# Patient Record
Sex: Male | Born: 1955 | Race: White | Hispanic: No | Marital: Married | Smoking: Former smoker
Health system: Southern US, Community
[De-identification: ages and names within clinical notes are randomized; demographics above are authoritative.]

## PROBLEM LIST (undated history)

## (undated) DIAGNOSIS — F329 Major depressive disorder, single episode, unspecified: Secondary | ICD-10-CM

## (undated) DIAGNOSIS — I10 Essential (primary) hypertension: Secondary | ICD-10-CM

## (undated) DIAGNOSIS — J302 Other seasonal allergic rhinitis: Secondary | ICD-10-CM

## (undated) DIAGNOSIS — F32A Depression, unspecified: Secondary | ICD-10-CM

## (undated) DIAGNOSIS — F209 Schizophrenia, unspecified: Secondary | ICD-10-CM

## (undated) HISTORY — DX: Other seasonal allergic rhinitis: J30.2

---

## 2000-08-16 ENCOUNTER — Emergency Department (HOSPITAL_COMMUNITY): Admission: EM | Admit: 2000-08-16 | Discharge: 2000-08-16 | Payer: Self-pay | Admitting: Emergency Medicine

## 2001-11-11 ENCOUNTER — Emergency Department (HOSPITAL_COMMUNITY): Admission: EM | Admit: 2001-11-11 | Discharge: 2001-11-11 | Payer: Self-pay | Admitting: Emergency Medicine

## 2001-11-11 ENCOUNTER — Encounter: Payer: Self-pay | Admitting: Family Medicine

## 2003-02-09 ENCOUNTER — Emergency Department (HOSPITAL_COMMUNITY): Admission: EM | Admit: 2003-02-09 | Discharge: 2003-02-09 | Payer: Self-pay | Admitting: Emergency Medicine

## 2004-07-19 ENCOUNTER — Ambulatory Visit: Payer: Self-pay | Admitting: Family Medicine

## 2004-09-03 ENCOUNTER — Ambulatory Visit: Payer: Self-pay | Admitting: Family Medicine

## 2004-11-29 ENCOUNTER — Ambulatory Visit: Payer: Self-pay | Admitting: Sports Medicine

## 2004-12-03 ENCOUNTER — Ambulatory Visit: Payer: Self-pay | Admitting: Family Medicine

## 2005-03-21 ENCOUNTER — Ambulatory Visit: Payer: Self-pay | Admitting: Sports Medicine

## 2005-08-15 ENCOUNTER — Ambulatory Visit: Payer: Self-pay | Admitting: Family Medicine

## 2006-12-11 DIAGNOSIS — F17200 Nicotine dependence, unspecified, uncomplicated: Secondary | ICD-10-CM | POA: Insufficient documentation

## 2006-12-11 DIAGNOSIS — F209 Schizophrenia, unspecified: Secondary | ICD-10-CM | POA: Insufficient documentation

## 2006-12-11 DIAGNOSIS — K219 Gastro-esophageal reflux disease without esophagitis: Secondary | ICD-10-CM | POA: Insufficient documentation

## 2008-01-18 ENCOUNTER — Emergency Department (HOSPITAL_COMMUNITY): Admission: EM | Admit: 2008-01-18 | Discharge: 2008-01-18 | Payer: Self-pay | Admitting: Emergency Medicine

## 2008-02-14 ENCOUNTER — Emergency Department (HOSPITAL_COMMUNITY): Admission: EM | Admit: 2008-02-14 | Discharge: 2008-02-14 | Payer: Self-pay | Admitting: Emergency Medicine

## 2010-08-23 ENCOUNTER — Emergency Department (HOSPITAL_COMMUNITY): Admission: EM | Admit: 2010-08-23 | Discharge: 2010-08-23 | Payer: Self-pay | Admitting: Emergency Medicine

## 2011-03-01 NOTE — Op Note (Signed)
[place]. [facility]  Patient:    Duane Hancock, Duane Hancock Visit Number: [id number] MRN: [id number]          Service Type: EMS Location: [place] Attending Physician:  Ilene Qua Dictated by:   Nicki Reaper, M.D. Proc. Date: 11/10/01 Admit Date:  11/11/2001                             Operative Report  PREOPERATIVE DIAGNOSIS:  Avulsion radial pulp left index finger.  POSTOPERATIVE DIAGNOSIS:  Avulsion radial pulp left index finger.  OPERATION:  Repair of skin graft, radial pulp and left index finger.  SURGEON:  Nicki Reaper, M.D.  ANESTHESIA:  Metacarpal block.  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  He suffered an avulsion of the radial pulp nail of his left index finger.  PROCEDURE:  The patient had been given a metacarpal block.  We used Betadine solution and a Penrose drain was used for tourniquet control to the patients left index finger.  The amputated portion was defatted.  The small fleck of bone was present and this was removed.  The finger tip was placed in its normal bed after pressure for hemostasis.  This was then sutured into position with interrupted 5-0 chromic sutures.  The nail fold was reapproximated with 5-0 chromic.  A stent dressing was applied with Steri-Strips.  Sterile compressive dressing and splint was applied.  The patient tolerated the procedure well.  He was discharged home to return in two weeks on Vicodin and Keflex. Dictated by:   Nicki Reaper, M.D. Attending Physician:  Ilene Qua DD:  11/11/01 TD:  11/11/01 Job: [id number]

## 2013-01-04 ENCOUNTER — Emergency Department (HOSPITAL_COMMUNITY)
Admission: EM | Admit: 2013-01-04 | Discharge: 2013-01-04 | Disposition: A | Discharge status: Home / Self Care | Payer: Medicare Other | Attending: Emergency Medicine | Admitting: Emergency Medicine

## 2013-01-04 ENCOUNTER — Encounter (HOSPITAL_COMMUNITY): Payer: Self-pay | Admitting: *Deleted

## 2013-01-04 DIAGNOSIS — R21 Rash and other nonspecific skin eruption: Secondary | ICD-10-CM | POA: Insufficient documentation

## 2013-01-04 DIAGNOSIS — Z79899 Other long term (current) drug therapy: Secondary | ICD-10-CM | POA: Insufficient documentation

## 2013-01-04 DIAGNOSIS — L299 Pruritus, unspecified: Secondary | ICD-10-CM | POA: Insufficient documentation

## 2013-01-04 DIAGNOSIS — F3289 Other specified depressive episodes: Secondary | ICD-10-CM | POA: Insufficient documentation

## 2013-01-04 DIAGNOSIS — F329 Major depressive disorder, single episode, unspecified: Secondary | ICD-10-CM | POA: Insufficient documentation

## 2013-01-04 DIAGNOSIS — Z87891 Personal history of nicotine dependence: Secondary | ICD-10-CM | POA: Insufficient documentation

## 2013-01-04 HISTORY — DX: Major depressive disorder, single episode, unspecified: F32.9

## 2013-01-04 HISTORY — DX: Depression, unspecified: F32.A

## 2013-01-04 MED ORDER — HYDROXYZINE HCL 25 MG PO TABS: 25.0000 mg | ORAL_TABLET | Freq: Four times a day (QID) | ORAL | Status: DC

## 2013-01-04 MED ORDER — SULFAMETHOXAZOLE-TRIMETHOPRIM 800-160 MG PO TABS: 1.0000 | ORAL_TABLET | Freq: Two times a day (BID) | ORAL | Status: DC

## 2013-01-04 MED ORDER — TRIAMCINOLONE ACETONIDE 0.1 % EX CREA: TOPICAL_CREAM | Freq: Three times a day (TID) | CUTANEOUS | Status: DC

## 2013-01-04 NOTE — ED Provider Notes (Signed)
History     CSN: [id number]  Arrival date & time 01/04/13  1235   First MD Initiated Contact with Patient 01/04/13 1343      Chief Complaint  Patient presents with  . Rash    (Consider location/radiation/quality/duration/timing/severity/associated sxs/prior treatment) HPI Comments: patient c/o rash to his right lower leg for 2 months.  States he was applying OTC creams and rash was improved, but then returned.  He c/o itching and his wife states that he scratches it in his sleep.  He denies swelling, new medications or similar rash elsewhere.  He states he is not a diabetic and does not have a hx of PVD  Patient is a 57 y.o. male presenting with rash. The history is provided by the patient.  Rash Location:  Leg Leg rash location:  R lower leg Quality: itchiness, redness and scaling   Quality: not blistering, not bruising, not burning, not draining, not dry, not painful, not peeling, not swelling and not weeping   Severity:  Moderate Onset quality:  Gradual Duration:  2 months Timing:  Constant Progression:  Waxing and waning Chronicity:  Chronic Context: not chemical exposure, not food, not medications, not new detergent/soap and not sick contacts   Relieved by:  Antibiotic cream and anti-itch cream Worsened by:  Nothing tried Ineffective treatments:  None tried Associated symptoms: no fever, no headaches, no induration, no joint pain, no myalgias, no nausea, no shortness of breath, no sore throat and not wheezing     Past Medical History  Diagnosis Date  . Depression     History reviewed. No pertinent past surgical history.  No family history on file.  History  Substance Use Topics  . Smoking status: Former Games developer  . Smokeless tobacco: Not on file  . Alcohol Use: No      Review of Systems  Constitutional: Negative for fever, chills, activity change and appetite change.  HENT: Negative for sore throat, facial swelling, trouble swallowing, neck pain and neck  stiffness.   Respiratory: Negative for chest tightness, shortness of breath and wheezing.   Gastrointestinal: Negative for nausea.  Musculoskeletal: Negative for myalgias and arthralgias.  Skin: Positive for color change and rash. Negative for wound.  Neurological: Negative for dizziness, weakness, numbness and headaches.  Hematological: Negative for adenopathy. Does not bruise/bleed easily.  All other systems reviewed and are negative.    Allergies  Review of patient's allergies indicates no known allergies.  Home Medications   Current Outpatient Rx  Name  Route  Sig  Dispense  Refill  . amitriptyline (ELAVIL) 25 MG tablet   Oral   Take 25 mg by mouth at bedtime.         Marland Kitchen omeprazole (PRILOSEC OTC) 20 MG tablet   Oral   Take 20 mg by mouth daily.           BP 144/97  Pulse 92  Temp(Src) 98.1 F (36.7 C) (Oral)  Resp 18  Ht 6' (1.829 m)  Wt 250 lb (113.399 kg)  BMI 33.9 kg/m2  SpO2 98%  Physical Exam  Nursing note and vitals reviewed. Constitutional: He is oriented to person, place, and time. He appears well-developed and well-nourished. No distress.  HENT:  Head: Normocephalic and atraumatic.  Cardiovascular: Normal rate, regular rhythm, normal heart sounds and intact distal pulses.   No murmur heard. Pulmonary/Chest: Effort normal and breath sounds normal. He has no wheezes.  Musculoskeletal: He exhibits no edema and no tenderness.  Neurological: He is alert and  oriented to person, place, and time. No cranial nerve deficit or sensory deficit. He exhibits normal muscle tone. Coordination and gait normal.  Skin: Skin is warm and dry. Rash noted.     Large , erythematous plaque to the medical right lower leg.  Excoriations also present and mild scaly areas.  No drainage, pus, or induration.  DP pulses brisk, distal sensation intact.      ED Course  Procedures (including critical care time)  Labs Reviewed - No data to display No results  found.      MDM    Erythematous plaque tot he medial right lower leg.  Several areas of excoriation present.  No drainage or induration.  NV intact.  No hx of PVD, or DM.    I will treat with steriod taper, antibiotic to cover for secondary infection, and vistaril .  Patient agrees to close f/u with his PMD.  Dermatology referral also given  The patient appears reasonably screened and/or stabilized for discharge and I doubt any other medical condition or other [facility] requiring further screening, evaluation, or treatment in the ED at this time prior to discharge.     Zyaira Vejar L. Trisha Mangle, PA-C 01/06/13 1548

## 2013-01-04 NOTE — ED Notes (Signed)
Rash to right lower leg x 2 months.  States is getting worse.  C/o itching.

## 2013-01-04 NOTE — ED Notes (Signed)
Alert, NAD, pt has red itching rash to bil lower legs, mostly medial lower aspect,  Rt leg is worse.  Pt has been using otc cortisone creme without improvement.

## 2013-01-07 NOTE — ED Provider Notes (Signed)
Medical screening examination/treatment/procedure(s) were performed by non-physician practitioner and as supervising physician I was immediately available for consultation/collaboration.  Christopher J. Pollina, MD 01/07/13 0700 

## 2013-12-07 ENCOUNTER — Encounter: Payer: Self-pay | Admitting: Internal Medicine

## 2014-01-13 ENCOUNTER — Ambulatory Visit (AMBULATORY_SURGERY_CENTER): Payer: Self-pay | Admitting: *Deleted

## 2014-01-13 VITALS — Ht 71.0 in | Wt 264.0 lb

## 2014-01-13 DIAGNOSIS — Z1211 Encounter for screening for malignant neoplasm of colon: Secondary | ICD-10-CM

## 2014-01-13 MED ORDER — MOVIPREP 100 G PO SOLR: 1.0000 | Freq: Once | ORAL | Status: DC

## 2014-01-13 NOTE — Progress Notes (Signed)
Denies allergies to eggs or soy products. Denies complications with sedation or anesthesia. 

## 2014-01-27 ENCOUNTER — Ambulatory Visit (AMBULATORY_SURGERY_CENTER): Payer: Medicare Other | Admitting: Internal Medicine

## 2014-01-27 ENCOUNTER — Encounter: Payer: Self-pay | Admitting: Internal Medicine

## 2014-01-27 VITALS — BP 110/75 | HR 69 | Temp 98.8°F | Resp 21 | Ht 71.0 in | Wt 264.0 lb

## 2014-01-27 DIAGNOSIS — D126 Benign neoplasm of colon, unspecified: Secondary | ICD-10-CM

## 2014-01-27 DIAGNOSIS — Z1211 Encounter for screening for malignant neoplasm of colon: Secondary | ICD-10-CM

## 2014-01-27 MED ORDER — SODIUM CHLORIDE 0.9 % IV SOLN: 500.0000 mL | INTRAVENOUS | Status: DC

## 2014-01-27 NOTE — Op Note (Signed)
[place]  [address]. [place], [address]   COLONOSCOPY PROCEDURE REPORT  PATIENT: Duane, Hancock  MR#: [id number] BIRTHDATE: 1956/09/10 , 66  yrs. old GENDER: Male ENDOSCOPIST: Jerene Bears, MD REFERRED GY:IRSWN Jeanie Cooks, M.D. PROCEDURE DATE:  01/27/2014 PROCEDURE:   Colonoscopy with cold biopsy polypectomy and Colonoscopy with snare polypectomy First Screening Colonoscopy - Avg.  risk and is 50 yrs.  old or older Yes.  Prior Negative Screening - Now for repeat screening. N/A  History of Adenoma - Now for follow-up colonoscopy & has been > or = to 3 yrs.  N/A  Polyps Removed Today? Yes. ASA CLASS:   Class II INDICATIONS:average risk screening and first colonoscopy. MEDICATIONS: MAC sedation, administered by CRNA and propofol (Diprivan) 500mg  IV  DESCRIPTION OF PROCEDURE:   After the risks benefits and alternatives of the procedure were thoroughly explained, informed consent was obtained.  A digital rectal exam revealed no rectal mass.   The LB [id number]  endoscope was introduced through the anus and advanced to the cecum, which was identified by both the appendix and ileocecal valve. No adverse events experienced. The quality of the prep was good, using MoviPrep  The instrument was then slowly withdrawn as the colon was fully examined.   COLON FINDINGS: Two sessile polyps measuring 5-6 mm in size were found at the cecum and in the transverse colon.  Polypectomy was performed using cold snare.  All resections were complete and all polyp tissue was completely retrieved.   Five sessile polyps measuring 3-8 mm in size were found in the descending colon (4) and sigmoid colon (1).  A polypectomy was performed with cold forceps (2), with a cold snare (2) and using snare cautery (1).  The resection was complete and the polyp tissue was completely retrieved.   A pedunculated polyp measuring 10 mm in size was found in the sigmoid colon.  A polypectomy was  performed using snare cautery.  The resection was complete and the polyp tissue was completely retrieved.   Mild diverticulosis was noted in the sigmoid colon.  Retroflexion was not performed due to a narrow rectal vault. The time to cecum=4 minutes 44 seconds.  Withdrawal time=17 minutes 51 seconds.  The scope was withdrawn and the procedure completed.  COMPLICATIONS: There were no complications.  ENDOSCOPIC IMPRESSION: 1.   Two sessile polyps measuring 5-6 mm in size were found at the cecum and in the transverse colon; Polypectomy was performed using cold snare 2.   Five sessile polyps measuring 3-8 mm in size were found in the descending colon and sigmoid colon; polypectomy was performed with cold forceps, with a cold snare and using snare cautery 3.   Pedunculated polyp measuring 10 mm in size was found in the sigmoid colon; polypectomy was performed using snare cautery 4.   Mild diverticulosis was noted in the sigmoid colon   RECOMMENDATIONS: 1.  Await pathology results 2.  Avoid NSAIDs for 2 weeks 3.  High fiber diet 4.  Timing of repeat colonoscopy will be determined by pathology findings. 5.  You will receive a letter within 1-2 weeks with the results of your biopsy as well as final recommendations.  Please call my office if you have not received a letter after 3 weeks.   eSigned:  Jerene Bears, MD 01/27/2014 8:51 AM   cc: The Patient and Nolene Ebbs, MD   PATIENT NAME:  Duane, Hancock MR#: [id number]

## 2014-01-27 NOTE — Patient Instructions (Addendum)
YOU HAD AN ENDOSCOPIC PROCEDURE TODAY AT THE [facility]: Refer to the procedure report that was given to you for any specific questions about what was found during the examination.  If the procedure report does not answer your questions, please call your gastroenterologist to clarify.  If you requested that your care partner not be given the details of your procedure findings, then the procedure report has been included in a sealed envelope for you to review at your convenience later.  YOU SHOULD EXPECT: Some feelings of bloating in the abdomen. Passage of more gas than usual.  Walking can help get rid of the air that was put into your GI tract during the procedure and reduce the bloating. If you had a lower endoscopy (such as a colonoscopy or flexible sigmoidoscopy) you may notice spotting of blood in your stool or on the toilet paper. If you underwent a bowel prep for your procedure, then you may not have a normal bowel movement for a few days.  DIET: Your first meal following the procedure should be a light meal and then it is ok to progress to your normal diet.  A half-sandwich or bowl of soup is an example of a good first meal.  Heavy or fried foods are harder to digest and may make you feel nauseous or bloated.  Likewise meals heavy in dairy and vegetables can cause extra gas to form and this can also increase the bloating.  Drink plenty of fluids but you should avoid alcoholic beverages for 24 hours.  ACTIVITY: Your care partner should take you home directly after the procedure.  You should plan to take it easy, moving slowly for the rest of the day.  You can resume normal activity the day after the procedure however you should NOT DRIVE or use heavy machinery for 24 hours (because of the sedation medicines used during the test).    SYMPTOMS TO REPORT IMMEDIATELY: A gastroenterologist can be reached at any hour.  During normal business hours, 8:30 AM to 5:00 PM Monday through Friday,  call [phone].  After hours and on weekends, please call the GI answering service at [phone] who will take a message and have the physician on call contact you.   Following lower endoscopy (colonoscopy or flexible sigmoidoscopy):  Excessive amounts of blood in the stool  Significant tenderness or worsening of abdominal pains  Swelling of the abdomen that is new, acute  Fever of 100F or higher    FOLLOW UP: If any biopsies were taken you will be contacted by phone or by letter within the next 1-3 weeks.  Call your gastroenterologist if you have not heard about the biopsies in 3 weeks.  Our staff will call the home number listed on your records the next business day following your procedure to check on you and address any questions or concerns that you may have at that time regarding the information given to you following your procedure. This is a courtesy call and so if there is no answer at the home number and we have not heard from you through the emergency physician on call, we will assume that you have returned to your regular daily activities without incident.  SIGNATURES/CONFIDENTIALITY: You and/or your care partner have signed paperwork which will be entered into your electronic medical record.  These signatures attest to the fact that that the information above on your After Visit Summary has been reviewed and is understood.  Full responsibility of the confidentiality   of this discharge information lies with you and/or your care-partner.    Handouts were given to the pt and his wife on polyps, diverticulosis and a high fiber diet with liberal fluids. Please hold and aspirin, aspirin products and any anti-inflammatory medication for 2 weeks. Use TYLENOL if needed for shoulder pain. You may resume your other current medications today. You may apply to your right forearm either cold compress or warm compress, which ever feels better, today.  This will help the infiltrated IV  site.  We will call and check on you in the am and Dr. Hilarie Fredrickson will have Marzetta Board, [place] call and check on you tomorrow afternoon. Await pathology result. Please call if any questions or concerns.        Please call if any questions or concerns.

## 2014-01-27 NOTE — Progress Notes (Signed)
0810 upon beginning induction of anesthesia 40mg  Propofol given, meeting some resistance from a flowing IV, it was noted on dependent side of wrist IV infiltrated, IV Immediately DCed, new IV started in top right hand x1 22g, freeflowing. DSD applied to dced IV, Dr. Hilarie Fredrickson aware and reviewed. P.Finnlee Guarnieri CRNA

## 2014-01-27 NOTE — Progress Notes (Signed)
On admission to the recovery room, Duane Dutton, CRNA reported infiltrated IV site at right forearm.  2x2 with tape at sight.  i applied warm pack to right forearm.  I advised pt we would call and check on him tomorrow am and Dr. Hilarie Fredrickson would have Duane Hancock, [place] call and check on him tomorrow afternoon also.  To call us back if his right forearm is causing pain or showing and signs or sx of infection was explained to his wife and to the pt. t denied any sx at the site now. Maw

## 2014-01-27 NOTE — Progress Notes (Signed)
Called to room to assist during endoscopic procedure.  Patient ID and intended procedure confirmed with present staff. Received instructions for my participation in the procedure from the performing physician.  

## 2014-01-27 NOTE — Progress Notes (Signed)
Report to pacu rn, vss, bbs=clear 

## 2014-01-27 NOTE — Progress Notes (Signed)
Pt said his right shoulder has been hurting, I explained why he should not take the ibuprofen he has been taking.  I advised he should take tylenol OTC for discomfort for 2 weeks.  maw

## 2014-01-28 ENCOUNTER — Emergency Department (HOSPITAL_COMMUNITY)
Admission: EM | Admit: 2014-01-28 | Discharge: 2014-01-28 | Disposition: A | Discharge status: Home / Self Care | Payer: Medicare Other | Attending: Emergency Medicine | Admitting: Emergency Medicine

## 2014-01-28 ENCOUNTER — Telehealth: Payer: Self-pay | Admitting: *Deleted

## 2014-01-28 ENCOUNTER — Emergency Department (HOSPITAL_COMMUNITY): Payer: Medicare Other

## 2014-01-28 ENCOUNTER — Encounter (HOSPITAL_COMMUNITY): Payer: Self-pay | Admitting: Emergency Medicine

## 2014-01-28 DIAGNOSIS — F329 Major depressive disorder, single episode, unspecified: Secondary | ICD-10-CM | POA: Insufficient documentation

## 2014-01-28 DIAGNOSIS — F3289 Other specified depressive episodes: Secondary | ICD-10-CM | POA: Insufficient documentation

## 2014-01-28 DIAGNOSIS — Z79899 Other long term (current) drug therapy: Secondary | ICD-10-CM | POA: Insufficient documentation

## 2014-01-28 DIAGNOSIS — IMO0002 Reserved for concepts with insufficient information to code with codable children: Secondary | ICD-10-CM | POA: Insufficient documentation

## 2014-01-28 DIAGNOSIS — M542 Cervicalgia: Secondary | ICD-10-CM | POA: Insufficient documentation

## 2014-01-28 DIAGNOSIS — Z87891 Personal history of nicotine dependence: Secondary | ICD-10-CM | POA: Insufficient documentation

## 2014-01-28 DIAGNOSIS — M25511 Pain in right shoulder: Secondary | ICD-10-CM

## 2014-01-28 DIAGNOSIS — M25519 Pain in unspecified shoulder: Secondary | ICD-10-CM | POA: Insufficient documentation

## 2014-01-28 DIAGNOSIS — M62838 Other muscle spasm: Secondary | ICD-10-CM

## 2014-01-28 HISTORY — DX: Schizophrenia, unspecified: F20.9

## 2014-01-28 MED ORDER — CYCLOBENZAPRINE HCL 5 MG PO TABS: 5.0000 mg | ORAL_TABLET | Freq: Two times a day (BID) | ORAL | Status: DC | PRN

## 2014-01-28 MED ORDER — HYDROCODONE-ACETAMINOPHEN 5-325 MG PO TABS: ORAL_TABLET | ORAL | Status: DC

## 2014-01-28 MED ORDER — HYDROCODONE-ACETAMINOPHEN 5-325 MG PO TABS
1.0000 | ORAL_TABLET | Freq: Once | ORAL | Status: AC
  Administered 2014-01-28: 1 via ORAL
  Filled 2014-01-28: qty 1

## 2014-01-28 NOTE — Discharge Instructions (Signed)
°Emergency Department Resource Guide °1) Find a Doctor and Pay Out of Pocket °Although you won't have to find out who is covered by your insurance plan, it is a good idea to ask around and get recommendations. You will then need to call the office and see if the doctor you have chosen will accept you as a new patient and what types of options they offer for patients who are self-pay. Some doctors offer discounts or will set up payment plans for their patients who do not have insurance, but you will need to ask so you aren't surprised when you get to your appointment. ° °2) Contact Your Local Health Department °Not all health departments have doctors that can see patients for sick visits, but many do, so it is worth a call to see if yours does. If you don't know where your local health department is, you can check in your phone book. The CDC also has a tool to help you locate your state's health department, and many state websites also have listings of all of their local health departments. ° °3) Find a Walk-in Clinic °If your illness is not likely to be very severe or complicated, you may want to try a walk in clinic. These are popping up all over the country in pharmacies, drugstores, and shopping centers. They're usually staffed by nurse practitioners or physician assistants that have been trained to treat common illnesses and complaints. They're usually fairly quick and inexpensive. However, if you have serious medical issues or chronic medical problems, these are probably not your best option. ° °No Primary Care Doctor: °- Call [facility] at  832-8000 - they can help you locate a primary care doctor that  accepts your insurance, provides certain services, etc. °- Physician Referral Service- 1-800-533-3463 ° °Chronic Pain Problems: °Organization         Address  Phone   Notes  °[facility]  (336) 297-2271 Patients need to be referred by their primary care doctor.  ° °Medication  Assistance: °Organization         Address  Phone   Notes  °[place] [facility] [address] °[place], [place] [address] (336) 641-8030 --Must be a resident of [place] °-- Must have NO insurance coverage whatsoever (no Medicaid/ Medicare, etc.) °-- The pt. MUST have a primary care doctor that directs their care regularly and follows them in the community °  °[facility]  (866) 331-1348   °[employer]  (888) 892-1162   ° °Agencies that provide inexpensive medical care: °Organization         Address  Phone   Notes  °[facility]  (336) 832-8035   °[facility]    (336) 832-7272   °[facility] [address] °[place], [place] [address] (336) 832-4777   °[facility] [address], °[place] (336) 271-4999   °[facility]    (336) 373-0678   °[facility]    (336) 272-1050   °[facility] ° [address], [place] Phone:  (336) 832-4444, Fax:  (336) 832-4440 Hours of Operation:  9 am - 6 pm, M-F.  Also accepts Medicaid/Medicare and self-pay.  °[facility] ° [address],  Phone: (336) 832-3150, Fax: (336) 832-3151. Hours of Operation:  8:30 am - 5:30 pm, M-F.  Also accepts Medicaid and self-pay.  °[facility] [place] [address], [place] Phone: (336) 878-6027   °[facility] [address], [place], [place] (336)723-1848, Ext. 123 Mondays & Thursdays: 7-9 AM.  First 15 patients are seen on a first come, first serve basis. °  ° °Medicaid-accepting [place] Providers: ° °Organization         Address  Phone   Notes  °[facility] [address], [place] (336) 641-2100 Also accepts self-pay patients.  °[facility] [address], [place] ° (336) 856-9996   °[facility] [address], [place]  (336) 288-8857   °[facility] [address] [place] [address], [place] (336) 299-7000   °Veita Bland [address], [place]  ° (336) 373-1557 Only accepts [place] Access Medicaid patients after they have their name applied to their card.  ° °Self-Pay (no insurance) in [place]: ° °Organization         Address  Phone   Notes  °Sickle Cell Patients, [facility] [address], [place] (336) 832-1970   °[facility] [address], [place] (336) 832-4400   °[facility] ° [address], [place] (336) 992-4800   °[facility]/Dr. Osei-Bonsu ° [address] [place] [address], [place] or [address], [place] (336) 841-8500 Phone number for both [place] and [place] locations is the same.  °[facility] [address], [place] (336) 299-0000   °[facility] [address] [place] [address], [place] or [address] (336) 852-7530 °(336) 878-2260   °[facility] [address], [place] (336) 350-1642, phone; (336) 294-5005, fax Sees patients 1st and 3rd Saturday of every month.  Must not qualify for public or private insurance (i.e. Medicaid, Medicare, [place] Health Choice, Veterans' Benefits) • Household income should be no more than 200% of the poverty level •The clinic cannot treat you if you are pregnant or think you are pregnant • Sexually transmitted diseases are not treated at the clinic.  ° ° °Dental Care: °Organization         Address  Phone  Notes  °[place] [facility] [address], [place] (336) 641-6152 Accepts children up to age 21 who are enrolled in Medicaid or [place] Health Choice; pregnant women with a Medicaid card; and children who have applied for Medicaid or [place] Health Choice, but were declined, whose parents can pay a reduced fee at time of service.  °[place]  [facility] [place]  [address], [place] (336) 641-7733 Accepts children up to age 21 who are enrolled in Medicaid or [place] Health Choice; pregnant women with a Medicaid card; and children who have applied for Medicaid or  Health Choice, but were declined, whose parents can pay a reduced fee at time of service.  °[facility] ° [address], [place] (336) 641-4533 Patients are seen by appointment only. Walk-ins are not accepted. [facility] will see patients 18 years of age and older. °Monday - Tuesday (8am-5pm) °Most Wednesdays (8:30-5pm) °$30 per visit, cash only  °[facility] ° [address], [place] (336) 641-4533 Patients are seen by appointment only. Walk-ins are not accepted. [facility] will see patients 18 years of age and older. °One   Wednesday Evening (Monthly: Volunteer Based).  $30 per visit, cash only  °[facility]  (919) 537-3737 for adults; Children under age 4, call Graduate Pediatric Dentistry at (919) [phone]. Children aged 4-14, please call (919) [phone] to request a pediatric application. ° Dental services are provided in all areas of dental care including fillings, crowns and bridges, complete and partial dentures, implants, gum treatment, root canals, and extractions. Preventive care is also provided. Treatment is provided to both adults and children. °Patients are selected via a lottery and there is often a waiting list. °  °[facility] [address], °[place] ° (336) 763-8833 [email or web address].[email or web address] °  °[facility] [address], [place], [place] (336)723-1848, Ext. 123 Second and Fourth Thursday of each month, opens at 6:30 AM; Clinic ends at 9 AM.  Patients are seen on a first-come first-served basis, and a limited number are seen during each clinic.  ° °[facility] ° [address], [place], [place] (336) 723-7904    Eligibility Requirements °You must have lived in [place], [place], or [place] counties for at least the last three months. °  You cannot be eligible for state or federal sponsored healthcare insurance, including Veterans Administration, Medicaid, or Medicare. °  You generally cannot be eligible for healthcare insurance through your employer.  °  How to apply: °Eligibility screenings are held every Tuesday and Wednesday afternoon from 1:00 pm until 4:00 pm. You do not need an appointment for the interview!  °[facility] [address], [place], [place] 336-631-2330   °[facility]  336-342-8273   °[facility]  336-703-3100   °[facility]  336-570-6415   ° °Behavioral Health Resources in the Community: °Intensive Outpatient Programs °Organization         Address  Phone  Notes  °[facility] [address], [place], [place] 336-878-6098   °[facility] [address], [place], [place] 336-832-9800   °[facility] [address], [place], [place] ° 336-882-2125   °[facility] [address],  °[place], [place] 1-800-853-5163 or 336-641-4981   °Substance Abuse Resources °Organization         Address  Phone  Notes  °[facility]  336-882-2125   °[facility]  336-784-9470   °[facility]  336-285-9073   °[facility]  336-845-3988   °Residential & Outpatient Substance Abuse Program  1-800-659-3381   °Psychological Services °Organization         Address  Phone  Notes  °[facility]  336- 832-9600   °[facility]  336- 378-7881   °[facility] [address], [place] 1-800-853-5163 or 336-641-4981   ° °Mobile Crisis Teams °Organization         Address  Phone  Notes  °[facility], Mobile Crisis Care Unit  1-877-626-1772   °Assertive °[facility] ° [address]  [place], [place] 336-834-9664   °Sharon DeEsch [address] °[place] 336-554-5454   ° °Self-Help/Support Groups °Organization         Address  Phone             Notes  °[facility]  - variety of support groups  336- 373-1402 Call for more information  °Narcotics Anonymous (NA), [facility] [address], °[place]  2 meetings at this location  ° °  Residential Treatment Programs Organization         Address  Phone  Notes  [facility] [address] [place] [address]    [place]  1-559-046-2004   [facility]  [address] [place] [address], [place], [place]   [place], [place] 605-084-5880 Admissions: 8am-3pm M-F  [facility] [place] [address],    [place], [place] [phone]   [facility] [address] [place], [place], [place]   [facility] [address]  [place], [place]   [facility] Intensive Outpatient [address], [place], [place]   [facility] ([place] [place]  [place], [place] 1-408-241-8348 or 267-267-5900   [facility] ([facility] [address], [place], [place] Accepts Medicaid  [facility] [address],  [place] 1-671-266-4051 Substance Abuse/Addiction Treatment   [facility]         Address  Phone  Notes  [facility]  661-329-3335   Domenic Schwab, PhD [address] [place], [place]   430-181-8163 or (929)018-2121   [facility] [place], [place] 412-679-7417   [facility] [address], [place], [place] (740)650-3271 Insurance/Medicaid/sponsorship through [facility] [address]                                    [place], [place] 508-661-7023 [place] [address], [place] (628) [phone]    Dr. Adele Schilder  [phone]   [facility] Dept. 1) [address] [place] 2) [place] 3)  [place] [address], [place] [phone]  [phone]   [place] 858-741-2661 or (760) 785-9756 (After Hours)       Take the prescriptions as directed.  Apply moist heat or ice to the area(s) of discomfort, for 15 minutes at a time, several times per day for the next few days.  Do not fall asleep on a heating or ice pack.  Call your regular medical doctor today to schedule a follow up appointment within the next 3 days.  Return to the Emergency Department immediately if worsening.

## 2014-01-28 NOTE — Telephone Encounter (Signed)
  Follow up Call-  Call back number 01/27/2014  Post procedure Call Back phone  # [phone]  Permission to leave phone message Yes    [phone]- LMOM and will call pt back

## 2014-01-28 NOTE — ED Notes (Signed)
Complain of pain in right shoulder. Denies any injury

## 2014-01-28 NOTE — ED Notes (Signed)
Pt alert & oriented x4, stable gait. Patient given discharge instructions, paperwork & prescription(s). Patient  instructed to stop at the registration desk to finish any additional paperwork. Patient verbalized understanding. Pt left department w/ no further questions. 

## 2014-01-28 NOTE — Telephone Encounter (Signed)
No answer with call at [phone]- no message left.  I informed Stacy Howald of pt's IV yesterday and that Dr. Hilarie Fredrickson wanted her to check with pt this afternoon

## 2014-01-28 NOTE — ED Provider Notes (Signed)
CSN: [id number]     Arrival date & time 01/28/14  4782 History   First MD Initiated Contact with Patient 01/28/14 [phone]     Chief Complaint  Patient presents with  . Shoulder Pain     HPI Pt was seen at 0835. Per pt, c/o gradual onset and persistence of constant right sided neck and shoulder "pain" that began approx 1 to 2 weeks ago. Pt states the pain began after he was "out working in the yard" (ie: mowing the lawn, using weed wacker/trimmer and leaf blower). Pt describes the pain as "spasms" that worsen with palpation of the area and body position changes. Denies abd pain, no N/V/D, no CP/SOB, no direct injury, no neck pain, no focal motor weakness, no tingling/numbness in extremities.     Past Medical History  Diagnosis Date  . Depression   . Seasonal allergies   . Schizophrenia    History reviewed. No pertinent past surgical history.  Family History  Problem Relation Age of Onset  . Colon cancer Father   . Esophageal cancer Neg Hx   . Stomach cancer Neg Hx   . Rectal cancer Neg Hx    History  Substance Use Topics  . Smoking status: Former Smoker    Quit date: 10/14/2010  . Smokeless tobacco: Never Used  . Alcohol Use: No    Review of Systems ROS: Statement: All systems negative except as marked or noted in the HPI; Constitutional: Negative for fever and chills. ; ; Eyes: Negative for eye pain, redness and discharge. ; ; ENMT: Negative for ear pain, hoarseness, nasal congestion, sinus pressure and sore throat. ; ; Cardiovascular: Negative for chest pain, palpitations, diaphoresis, dyspnea and peripheral edema. ; ; Respiratory: Negative for cough, wheezing and stridor. ; ; Gastrointestinal: Negative for nausea, vomiting, diarrhea, abdominal pain, blood in stool, hematemesis, jaundice and rectal bleeding. . ; ; Genitourinary: Negative for dysuria, flank pain and hematuria. ; ; Musculoskeletal: +right sided neck and shoulder pain. Negative for back pain. Negative for swelling and  trauma.; ; Skin: Negative for pruritus, rash, abrasions, blisters, bruising and skin lesion.; ; Neuro: Negative for headache, lightheadedness and neck stiffness. Negative for weakness, altered level of consciousness , altered mental status, extremity weakness, paresthesias, involuntary movement, seizure and syncope.      Allergies  Review of patient's allergies indicates no known allergies.  Home Medications   Prior to Admission medications   Medication Sig Start Date End Date Taking? Authorizing Provider  acetaminophen (TYLENOL) 500 MG tablet Take 1,000 mg by mouth daily as needed for moderate pain.   Yes Historical Provider, MD  amitriptyline (ELAVIL) 25 MG tablet Take 25 mg by mouth at bedtime.   Yes Historical Provider, MD  NEXIUM 40 MG capsule Take 1 capsule by mouth daily. 01/05/14  Yes Historical Provider, MD  omeprazole (PRILOSEC OTC) 20 MG tablet Take 20 mg by mouth daily.   Yes Historical Provider, MD  pramipexole (MIRAPEX) 0.125 MG tablet Take 0.125 mg by mouth at bedtime and may repeat dose one time if needed.   Yes Historical Provider, MD  traZODone (DESYREL) 100 MG tablet Take 150 mg by mouth at bedtime.   Yes Historical Provider, MD  triamcinolone cream (KENALOG) 0.1 % Apply topically 3 (three) times daily. Use sparingly 01/04/13  Yes Tammy L. Triplett, PA-C   BP 143/89  Temp(Src) 97.9 F (36.6 C) (Oral)  Resp 20  Wt 263 lb (119.296 kg)  SpO2 96% Physical Exam 0840: Physical examination:  Nursing  notes reviewed; Vital signs and O2 SAT reviewed;  Constitutional: Well developed, Well nourished, Well hydrated, In no acute distress; Head:  Normocephalic, atraumatic; Eyes: EOMI, PERRL, No scleral icterus; ENMT: Mouth and pharynx normal, Mucous membranes moist; Neck: Supple, Full range of motion, No lymphadenopathy; Cardiovascular: Regular rate and rhythm, No murmur, rub, or gallop; Respiratory: Breath sounds clear & equal bilaterally, No rales, rhonchi, wheezes.  Speaking full  sentences with ease, Normal respiratory effort/excursion; Chest: Nontender, Movement normal; Abdomen: Soft, Nontender, Nondistended, Normal bowel sounds; Genitourinary: No CVA tenderness; Spine:  No midline CS, TS, LS tenderness. +TTP right hypertonic trapezius muscle. No rash.;; Extremities: Pulses normal, No deformity. No rash, no ecchymosis, no edema, no erythema. Right shoulder w/FROM, drop test normal.  +generalized mild tenderness to palp entire joint without specific area of point tenderness. Right clavicle NT, scapula NT, proximal humerus NT, biceps tendon NT over bicipital groove.  Motor strength at shoulder normal.  Sensation intact over deltoid region, distal NMS intact with right hand having intact and equal sensation and strength in the distribution of the median, radial, and ulnar nerve function compared to opposite side.  Strong radial pulse.  +FROM right elbow with intact motor strength biceps and triceps muscles to resistance..; Neuro: AA&Ox3, Major CN grossly intact.  Speech clear. No gross focal motor or sensory deficits in extremities. Climbs on and off stretcher easily by himself. Gait steady.; Skin: Color normal, Warm, Dry.   ED Course  Procedures     EKG Interpretation None      MDM  MDM Reviewed: previous chart, nursing note and vitals Interpretation: x-ray    Dg Shoulder Right 01/28/2014   CLINICAL DATA:  Atraumatic right shoulder pain and spasm  EXAM: RIGHT SHOULDER - 2+ VIEW  COMPARISON:  None.  FINDINGS: Three views of the right shoulder reveal the bones to be adequately mineralized. There is no evidence of an acute fracture nor dislocation. The glenohumeral joint and AC joint appear normal. The observed portions of the right clavicle and upper right ribs also appear normal. The overlying soft tissues exhibit no definite acute abnormalities. On the Y-view only there is soft tissue density adjacent to the proximal. Diaphysis of the humerus that is of uncertain etiology.   IMPRESSION: There is no acute bony abnormality of the right shoulder. If the patient's symptoms persist and remain unexplained, further evaluation with MRI may be useful. It is increased soft tissue density over the proximal humeral shaft on one view only is nonspecific. Correlation with any symptoms in this region would be useful.   Electronically Signed   By: Ezeriah  [place]   On: 01/28/2014 09:01    0910:  Right trapezius muscle spasm after "working in the yard." No focal motor or sensory deficit, no midline CS tenderness, no injury. Denies abd pain/CP. Will tx symptomatically at this time. Dx and testing d/w pt and family.  Questions answered.  Verb understanding, agreeable to d/c home with outpt f/u.   Alfonzo Feller, DO 01/31/14 [phone]

## 2014-01-31 ENCOUNTER — Telehealth: Payer: Self-pay | Admitting: Gastroenterology

## 2014-01-31 NOTE — Telephone Encounter (Signed)
lvm for pt to see how his arm is healing

## 2014-02-01 ENCOUNTER — Encounter: Payer: Self-pay | Admitting: Internal Medicine

## 2015-12-20 ENCOUNTER — Emergency Department (HOSPITAL_COMMUNITY): Payer: Medicare Other

## 2015-12-20 ENCOUNTER — Emergency Department (HOSPITAL_COMMUNITY)
Admission: EM | Admit: 2015-12-20 | Discharge: 2015-12-20 | Disposition: A | Discharge status: Home / Self Care | Payer: Medicare Other | Attending: Emergency Medicine | Admitting: Emergency Medicine

## 2015-12-20 ENCOUNTER — Encounter (HOSPITAL_COMMUNITY): Payer: Self-pay | Admitting: Emergency Medicine

## 2015-12-20 DIAGNOSIS — K5900 Constipation, unspecified: Secondary | ICD-10-CM | POA: Insufficient documentation

## 2015-12-20 DIAGNOSIS — R140 Abdominal distension (gaseous): Secondary | ICD-10-CM | POA: Insufficient documentation

## 2015-12-20 DIAGNOSIS — Z87891 Personal history of nicotine dependence: Secondary | ICD-10-CM | POA: Diagnosis not present

## 2015-12-20 DIAGNOSIS — F209 Schizophrenia, unspecified: Secondary | ICD-10-CM | POA: Diagnosis not present

## 2015-12-20 DIAGNOSIS — Z79899 Other long term (current) drug therapy: Secondary | ICD-10-CM | POA: Diagnosis not present

## 2015-12-20 DIAGNOSIS — R0602 Shortness of breath: Secondary | ICD-10-CM | POA: Insufficient documentation

## 2015-12-20 DIAGNOSIS — F329 Major depressive disorder, single episode, unspecified: Secondary | ICD-10-CM | POA: Diagnosis not present

## 2015-12-20 DIAGNOSIS — R110 Nausea: Secondary | ICD-10-CM | POA: Diagnosis not present

## 2015-12-20 DIAGNOSIS — R6889 Other general symptoms and signs: Secondary | ICD-10-CM

## 2015-12-20 DIAGNOSIS — R509 Fever, unspecified: Secondary | ICD-10-CM | POA: Insufficient documentation

## 2015-12-20 LAB — URINE MICROSCOPIC-ADD ON: WBC, UA: NONE SEEN WBC/hpf (ref 0–5)

## 2015-12-20 LAB — CBC WITH DIFFERENTIAL/PLATELET
BASOS ABS: 0.1 10*3/uL (ref 0.0–0.1)
BASOS ABS: 0.1 10*3/uL (ref 0.0–0.1)
BASOS PCT: 1 %
Basophils Relative: 1 %
EOS ABS: 0.2 10*3/uL (ref 0.0–0.7)
EOS PCT: 1 %
EOS PCT: 2 %
Eosinophils Absolute: 0.1 10*3/uL (ref 0.0–0.7)
HCT: 41.7 % (ref 39.0–52.0)
HEMATOCRIT: 40.2 % (ref 39.0–52.0)
Hemoglobin: 14 g/dL (ref 13.0–17.0)
Hemoglobin: 13.6 g/dL (ref 13.0–17.0)
LYMPHS PCT: 25 %
Lymphocytes Relative: 34 %
Lymphs Abs: 2.1 10*3/uL (ref 0.7–4.0)
Lymphs Abs: 2.3 10*3/uL (ref 0.7–4.0)
MCH: 29.7 pg (ref 26.0–34.0)
MCH: 29.8 pg (ref 26.0–34.0)
MCHC: 33.6 g/dL (ref 30.0–36.0)
MCHC: 33.8 g/dL (ref 30.0–36.0)
MCV: 88.5 fL (ref 78.0–100.0)
MCV: 88 fL (ref 78.0–100.0)
MONO ABS: 0.6 10*3/uL (ref 0.1–1.0)
MONOS PCT: 9 %
Monocytes Absolute: 0.6 10*3/uL (ref 0.1–1.0)
Monocytes Relative: 7 %
NEUTROS PCT: 55 %
Neutro Abs: 5.6 10*3/uL (ref 1.7–7.7)
Neutro Abs: 3.8 10*3/uL (ref 1.7–7.7)
Neutrophils Relative %: 65 %
PLATELETS: 111 10*3/uL — AB (ref 150–400)
PLATELETS: 112 10*3/uL — AB (ref 150–400)
RBC: 4.57 MIL/uL (ref 4.22–5.81)
RBC: 4.71 MIL/uL (ref 4.22–5.81)
RDW: 13.1 % (ref 11.5–15.5)
RDW: 13.3 % (ref 11.5–15.5)
WBC: 6.9 10*3/uL (ref 4.0–10.5)
WBC: 8.5 10*3/uL (ref 4.0–10.5)

## 2015-12-20 LAB — COMPREHENSIVE METABOLIC PANEL
ALK PHOS: 75 U/L (ref 38–126)
ALT: 101 U/L — ABNORMAL HIGH (ref 17–63)
AST: 94 U/L — AB (ref 15–41)
Albumin: 3.7 g/dL (ref 3.5–5.0)
Anion gap: 13 (ref 5–15)
BILIRUBIN TOTAL: 0.3 mg/dL (ref 0.3–1.2)
BUN: 15 mg/dL (ref 6–20)
CALCIUM: 9.3 mg/dL (ref 8.9–10.3)
CO2: 21 mmol/L — ABNORMAL LOW (ref 22–32)
Chloride: 102 mmol/L (ref 101–111)
Creatinine, Ser: 1.34 mg/dL — ABNORMAL HIGH (ref 0.61–1.24)
GFR calc Af Amer: >60 mL/min (ref >60)
GFR, EST NON AFRICAN AMERICAN: 57 mL/min — AB (ref >60)
GLUCOSE: 144 mg/dL — AB (ref 65–99)
POTASSIUM: 4.4 mmol/L (ref 3.5–5.1)
Sodium: 136 mmol/L (ref 135–145)
TOTAL PROTEIN: 7.4 g/dL (ref 6.5–8.1)

## 2015-12-20 LAB — URINALYSIS, ROUTINE W REFLEX MICROSCOPIC
Bilirubin Urine: NEGATIVE
GLUCOSE, UA: NEGATIVE mg/dL
KETONES UR: NEGATIVE mg/dL
LEUKOCYTES UA: NEGATIVE
NITRITE: NEGATIVE
PROTEIN: 30 mg/dL — AB
Specific Gravity, Urine: 1.019 (ref 1.005–1.030)
pH: 5 (ref 5.0–8.0)

## 2015-12-20 LAB — LIPASE, BLOOD: Lipase: 20 U/L (ref 11–51)

## 2015-12-20 MED ORDER — SODIUM CHLORIDE 0.9 % IV SOLN
INTRAVENOUS | Status: DC
  Administered 2015-12-20: 08:00:00 via INTRAVENOUS

## 2015-12-20 MED ORDER — ALBUTEROL SULFATE HFA 108 (90 BASE) MCG/ACT IN AERS
2.0000 | INHALATION_SPRAY | Freq: Four times a day (QID) | RESPIRATORY_TRACT | Status: DC
  Administered 2015-12-20: 2 via RESPIRATORY_TRACT
  Filled 2015-12-20: qty 6.7

## 2015-12-20 MED ORDER — SODIUM CHLORIDE 0.9 % IV BOLUS (SEPSIS)
500.0000 mL | Freq: Once | INTRAVENOUS | Status: AC
  Administered 2015-12-20: 500 mL via INTRAVENOUS

## 2015-12-20 MED ORDER — ONDANSETRON HCL 4 MG/2ML IJ SOLN
4.0000 mg | Freq: Once | INTRAMUSCULAR | Status: AC
  Administered 2015-12-20: 4 mg via INTRAVENOUS
  Filled 2015-12-20: qty 2

## 2015-12-20 MED ORDER — ALBUTEROL SULFATE (2.5 MG/3ML) 0.083% IN NEBU
INHALATION_SOLUTION | RESPIRATORY_TRACT | Status: AC
  Filled 2015-12-20: qty 6

## 2015-12-20 MED ORDER — IOHEXOL 300 MG/ML  SOLN
100.0000 mL | Freq: Once | INTRAMUSCULAR | Status: AC | PRN
  Administered 2015-12-20: 100 mL via INTRAVENOUS

## 2015-12-20 MED ORDER — ALBUTEROL SULFATE (2.5 MG/3ML) 0.083% IN NEBU
5.0000 mg | INHALATION_SOLUTION | Freq: Once | RESPIRATORY_TRACT | Status: AC
  Administered 2015-12-20: 5 mg via RESPIRATORY_TRACT

## 2015-12-20 NOTE — ED Notes (Signed)
Pt. reports SOB / orthopnea with persistent dry cough and abdominal swelling onset last week , denies fever or chills.

## 2015-12-20 NOTE — ED Provider Notes (Signed)
CSN: [id number]     Arrival date & time 12/20/15  0023 History   First MD Initiated Contact with Patient 12/20/15 [phone]     Chief Complaint  Patient presents with  . Shortness of Breath     (Consider location/radiation/quality/duration/timing/severity/associated sxs/prior Treatment) Patient is a 60 y.o. male presenting with shortness of breath. The history is provided by the patient and the spouse.  Shortness of Breath Associated symptoms: cough and fever   Associated symptoms: no chest pain, no headaches, no rash, no sore throat and no vomiting   Patient was several day complaint of not feeling well symptoms started on Friday nonproductive cough fever feeling shortness of breath congestion and a longer period of time of abdominal distention occasional nausea but no vomiting or diarrhea. No dysuria no chest pain area.  Patient's been taking over-the-counter cold and flu meds at home without any significant improvement.  Patient's past medical history is significant for schizophrenia.  Past Medical History  Diagnosis Date  . Depression   . Seasonal allergies   . Schizophrenia ([place])    History reviewed. No pertinent past surgical history. Family History  Problem Relation Age of Onset  . Colon cancer Father   . Esophageal cancer Neg Hx   . Stomach cancer Neg Hx   . Rectal cancer Neg Hx    Social History  Substance Use Topics  . Smoking status: Former Smoker    Quit date: 10/14/2010  . Smokeless tobacco: Never Used  . Alcohol Use: No    Review of Systems  Constitutional: Positive for fever and fatigue.  HENT: Positive for congestion. Negative for sore throat.   Eyes: Negative for redness.  Respiratory: Positive for cough and shortness of breath.   Cardiovascular: Negative for chest pain.  Gastrointestinal: Positive for nausea, constipation and abdominal distention. Negative for vomiting and diarrhea.  Genitourinary: Negative for dysuria.  Musculoskeletal: Negative for back  pain.  Skin: Negative for rash.  Neurological: Negative for headaches.  Hematological: Does not bruise/bleed easily.  Psychiatric/Behavioral: Negative for confusion.      Allergies  Review of patient's allergies indicates no known allergies.  Home Medications   Prior to Admission medications   Medication Sig Start Date End Date Taking? Authorizing Provider  acetaminophen (TYLENOL) 500 MG tablet Take 1,000 mg by mouth daily as needed for moderate pain.   Yes Historical Provider, MD  amitriptyline (ELAVIL) 25 MG tablet Take 150 mg by mouth at bedtime.    Yes Historical Provider, MD  diclofenac (VOLTAREN) 75 MG EC tablet Take 75 mg by mouth 2 (two) times daily as needed for mild pain.   Yes Historical Provider, MD  NEXIUM 40 MG capsule Take 1 capsule by mouth daily. 01/05/14  Yes Historical Provider, MD  traZODone (DESYREL) 100 MG tablet Take 75 mg by mouth at bedtime.    Yes Historical Provider, MD  zolpidem (AMBIEN) 10 MG tablet Take 10 mg by mouth at bedtime as needed for sleep.   Yes Historical Provider, MD   BP 117/78 mmHg  Pulse 88  Temp(Src) 98.5 F (36.9 C) (Oral)  Resp 16  Wt 117.198 kg  SpO2 93% Physical Exam  Constitutional: He is oriented to person, place, and time. He appears well-developed and well-nourished. No distress.  HENT:  Head: Normocephalic and atraumatic.  Mouth/Throat: Oropharynx is clear and moist. No oropharyngeal exudate.  Eyes: Conjunctivae and EOM are normal. Pupils are equal, round, and reactive to light.  Neck: Normal range of motion. Neck supple.  Cardiovascular: Normal rate, regular rhythm and normal heart sounds.   No murmur heard. Pulmonary/Chest: Effort normal and breath sounds normal. No respiratory distress.  Abdominal: Soft. Bowel sounds are normal. He exhibits distension. There is no tenderness.  Musculoskeletal: Normal range of motion.  Neurological: He is alert and oriented to person, place, and time. No cranial nerve deficit. He  exhibits normal muscle tone. Coordination normal.  Skin: Skin is warm. No rash noted.  Nursing note and vitals reviewed.   ED Course  Procedures (including critical care time) Labs Review Labs Reviewed  COMPREHENSIVE METABOLIC PANEL - Abnormal; Notable for the following:    CO2 21 (*)    Glucose, Bld 144 (*)    Creatinine, Ser 1.34 (*)    AST 94 (*)    ALT 101 (*)    GFR calc non Af Amer 57 (*)    All other components within normal limits  CBC WITH DIFFERENTIAL/PLATELET - Abnormal; Notable for the following:    Platelets 112 (*)    All other components within normal limits  URINALYSIS, ROUTINE W REFLEX MICROSCOPIC (NOT AT [facility]) - Abnormal; Notable for the following:    Hgb urine dipstick SMALL (*)    Protein, ur 30 (*)    All other components within normal limits  URINE MICROSCOPIC-ADD ON - Abnormal; Notable for the following:    Squamous Epithelial / LPF 0-5 (*)    Bacteria, UA RARE (*)    Casts HYALINE CASTS (*)    All other components within normal limits  CBC WITH DIFFERENTIAL/PLATELET - Abnormal; Notable for the following:    Platelets 111 (*)    All other components within normal limits  LIPASE, BLOOD   Results for orders placed or performed during the hospital encounter of 12/20/15  Comprehensive metabolic panel  Result Value Ref Range   Sodium 136 135 - 145 mmol/L   Potassium 4.4 3.5 - 5.1 mmol/L   Chloride 102 101 - 111 mmol/L   CO2 21 (L) 22 - 32 mmol/L   Glucose, Bld 144 (H) 65 - 99 mg/dL   BUN 15 6 - 20 mg/dL   Creatinine, Ser 1.34 (H) 0.61 - 1.24 mg/dL   Calcium 9.3 8.9 - 10.3 mg/dL   Total Protein 7.4 6.5 - 8.1 g/dL   Albumin 3.7 3.5 - 5.0 g/dL   AST 94 (H) 15 - 41 U/L   ALT 101 (H) 17 - 63 U/L   Alkaline Phosphatase 75 38 - 126 U/L   Total Bilirubin 0.3 0.3 - 1.2 mg/dL   GFR calc non Af Amer 57 (L) >60 mL/min   GFR calc Af Amer >60 >60 mL/min   Anion gap 13 5 - 15  CBC with Differential  Result Value Ref Range   WBC 8.5 4.0 - 10.5 K/uL   RBC  4.71 4.22 - 5.81 MIL/uL   Hemoglobin 14.0 13.0 - 17.0 g/dL   HCT 41.7 39.0 - 52.0 %   MCV 88.5 78.0 - 100.0 fL   MCH 29.7 26.0 - 34.0 pg   MCHC 33.6 30.0 - 36.0 g/dL   RDW 13.1 11.5 - 15.5 %   Platelets 112 (L) 150 - 400 K/uL   Neutrophils Relative % 65 %   Neutro Abs 5.6 1.7 - 7.7 K/uL   Lymphocytes Relative 25 %   Lymphs Abs 2.1 0.7 - 4.0 K/uL   Monocytes Relative 7 %   Monocytes Absolute 0.6 0.1 - 1.0 K/uL   Eosinophils Relative 2 %  Eosinophils Absolute 0.2 0.0 - 0.7 K/uL   Basophils Relative 1 %   Basophils Absolute 0.1 0.0 - 0.1 K/uL  Urinalysis, Routine w reflex microscopic (not at [facility])  Result Value Ref Range   Color, Urine YELLOW YELLOW   APPearance CLEAR CLEAR   Specific Gravity, Urine 1.019 1.005 - 1.030   pH 5.0 5.0 - 8.0   Glucose, UA NEGATIVE NEGATIVE mg/dL   Hgb urine dipstick SMALL (A) NEGATIVE   Bilirubin Urine NEGATIVE NEGATIVE   Ketones, ur NEGATIVE NEGATIVE mg/dL   Protein, ur 30 (A) NEGATIVE mg/dL   Nitrite NEGATIVE NEGATIVE   Leukocytes, UA NEGATIVE NEGATIVE  Urine microscopic-add on  Result Value Ref Range   Squamous Epithelial / LPF 0-5 (A) NONE SEEN   WBC, UA NONE SEEN 0 - 5 WBC/hpf   RBC / HPF 0-5 0 - 5 RBC/hpf   Bacteria, UA RARE (A) NONE SEEN   Casts HYALINE CASTS (A) NEGATIVE  CBC with Differential/Platelet  Result Value Ref Range   WBC 6.9 4.0 - 10.5 K/uL   RBC 4.57 4.22 - 5.81 MIL/uL   Hemoglobin 13.6 13.0 - 17.0 g/dL   HCT 40.2 39.0 - 52.0 %   MCV 88.0 78.0 - 100.0 fL   MCH 29.8 26.0 - 34.0 pg   MCHC 33.8 30.0 - 36.0 g/dL   RDW 13.3 11.5 - 15.5 %   Platelets 111 (L) 150 - 400 K/uL   Neutrophils Relative % 55 %   Lymphocytes Relative 34 %   Monocytes Relative 9 %   Eosinophils Relative 1 %   Basophils Relative 1 %   Neutro Abs 3.8 1.7 - 7.7 K/uL   Lymphs Abs 2.3 0.7 - 4.0 K/uL   Monocytes Absolute 0.6 0.1 - 1.0 K/uL   Eosinophils Absolute 0.1 0.0 - 0.7 K/uL   Basophils Absolute 0.1 0.0 - 0.1 K/uL   WBC Morphology ATYPICAL  LYMPHOCYTES   Lipase, blood  Result Value Ref Range   Lipase 20 11 - 51 U/L     Imaging Review Dg Chest 2 View  12/20/2015  CLINICAL DATA:  Shortness of breath, dry cough and abdominal swelling for 1 week. EXAM: CHEST  2 VIEW COMPARISON:  01/18/2008 FINDINGS: Borderline cardiomegaly with mild vascular congestion. No pulmonary edema. Minimal scarring in the lingula. No confluent airspace disease, pleural effusion or pneumothorax. Mild degenerative change in the spine. IMPRESSION: Borderline cardiomegaly with mild vascular congestion. No frank pulmonary edema. Electronically Signed   By: Jeb Levering M.D.   On: 12/20/2015 01:05   Ct Abdomen Pelvis W Contrast  12/20/2015  CLINICAL DATA:  RIGHT lower quadrant abdominal pain and swelling for 2 weeks getting worse, belching, former smoker EXAM: CT ABDOMEN AND PELVIS WITH CONTRAST TECHNIQUE: Multidetector CT imaging of the abdomen and pelvis was performed using the standard protocol following bolus administration of intravenous contrast. Sagittal and coronal MPR images reconstructed from axial data set. CONTRAST:  150mL OMNIPAQUE IOHEXOL 300 MG/ML SOLN IV. No oral contrast administered. COMPARISON:  None FINDINGS: Motion artifacts at lung bases and throughout abdomen limit exam. Questionable nodule 3 mm LEFT lower lobe image 13 and LEFT lower lobe 6 mm diameter image 2. Within limitations of motion, liver, gallbladder, spleen, pancreas, and adrenal glands unremarkable. Symmetric nephrograms with 10 x 10 mm low-attenuation lesion at inferior pole LEFT kidney incompletely characterized. Stomach decompressed. Normal appendix. Bowel loops suboptimally evaluated due to motion and lack of opacification, no gross abnormalities identified. Normal appearing bladder and ureters. No mass,  adenopathy, free air or free fluid. Osseous structures unremarkable. Question tiny LEFT inguinal hernia containing fat. IMPRESSION: Nonspecific low-attenuation lesion inferior pole  LEFT kidney 10 mm diameter; due to body habitus and position/size of lesion this is likely to be difficult to assess by ultrasound; recommend followup MR characterization and assessment of stability in 6 months. Question tiny bibasilar pulmonary nodules versus artifacts from motion; recommendation below. If the patient is at high risk for bronchogenic carcinoma, follow-up chest CT at 6-12 months is recommended. If the patient is at low risk for bronchogenic carcinoma, follow-up chest CT at 12 months is recommended. This recommendation follows the consensus statement: Guidelines for Management of Small Pulmonary Nodules Detected on CT Scans: A Statement from the [place] as published in Radiology 2005;237:395-400. Electronically Signed   By: Lavonia Dana M.D.   On: 12/20/2015 10:09   I have personally reviewed and evaluated these images and lab results as part of my medical decision-making.   EKG Interpretation   Date/Time:  Wednesday December 20 2015 00:29:32 EST Ventricular Rate:  117 PR Interval:  114 QRS Duration: 76 QT Interval:  300 QTC Calculation: 418 R Axis:   84 Text Interpretation:  Sinus tachycardia Low voltage QRS Borderline ECG No  previous ECGs available Confirmed by Rochele Lueck  MD, Korey Arroyo ([id number]) on  12/20/2015 7:25:11 AM      MDM   Final diagnoses:  Abdominal distension  SOB (shortness of breath)  Flu-like symptoms      Patient with extensive workup for the shortness of breath dry cough abdominal swelling without any significant findings other the incidental finding of pulmonary nodules and a question small abnormality with one of the kidneys. Not related to today's complaint. Patient nontoxic no acute distress. Oxygen saturation is a been consistently above 90%. No fevers. Chest x-rays negative for pneumonia. Labs without significant abnormalities. May very well be a viral process that is working on him. Patient for the shortness of breath or give him a trial of an  albuterol inhaler. Copies of his lab results printed out for his wife to take to his regular doctor. So that he can get appropriate follow-up. Patient denied any chest pain. No CT findings to explain the abdominal distention.  Patient's wife seems to understand the need for follow-up CT scan in one year. And for possible consideration of MRI to evaluate the kidney.     Fredia Sorrow, MD 12/20/15 1158

## 2015-12-20 NOTE — ED Notes (Signed)
Pt.,s family member came to the desk stated that pt.o2 was droping recheck pt.sats 97%ra.and hr;103

## 2015-12-20 NOTE — Discharge Instructions (Signed)
Use albuterol inhaler 2 puffs every 6 hours for the next 7 days then as needed. Return for any new or worse symptoms. Today's workup to include chest x-ray and abdominal CT without any acute findings. To explain your symptoms. It may be related to an upper respiratory infection or flulike illness. Albuterol inhaler may help with the shortness of breath. Make an appointment to follow-up with your regular doctor. Radiology is recommending a follow-up CT scan of your chest to evaluate some pulmonary nodules in 6 months. No requirement for CT scan of the chest today.

## 2016-07-11 IMAGING — DX DG CHEST 2V
2 series · 2 of 2 positions shown · non-contrast
Comparison: 01/18/2008

CLINICAL DATA: Shortness of breath, dry cough and abdominal
swelling for 1 week.

EXAM:
CHEST  2 VIEW

[chest pa]
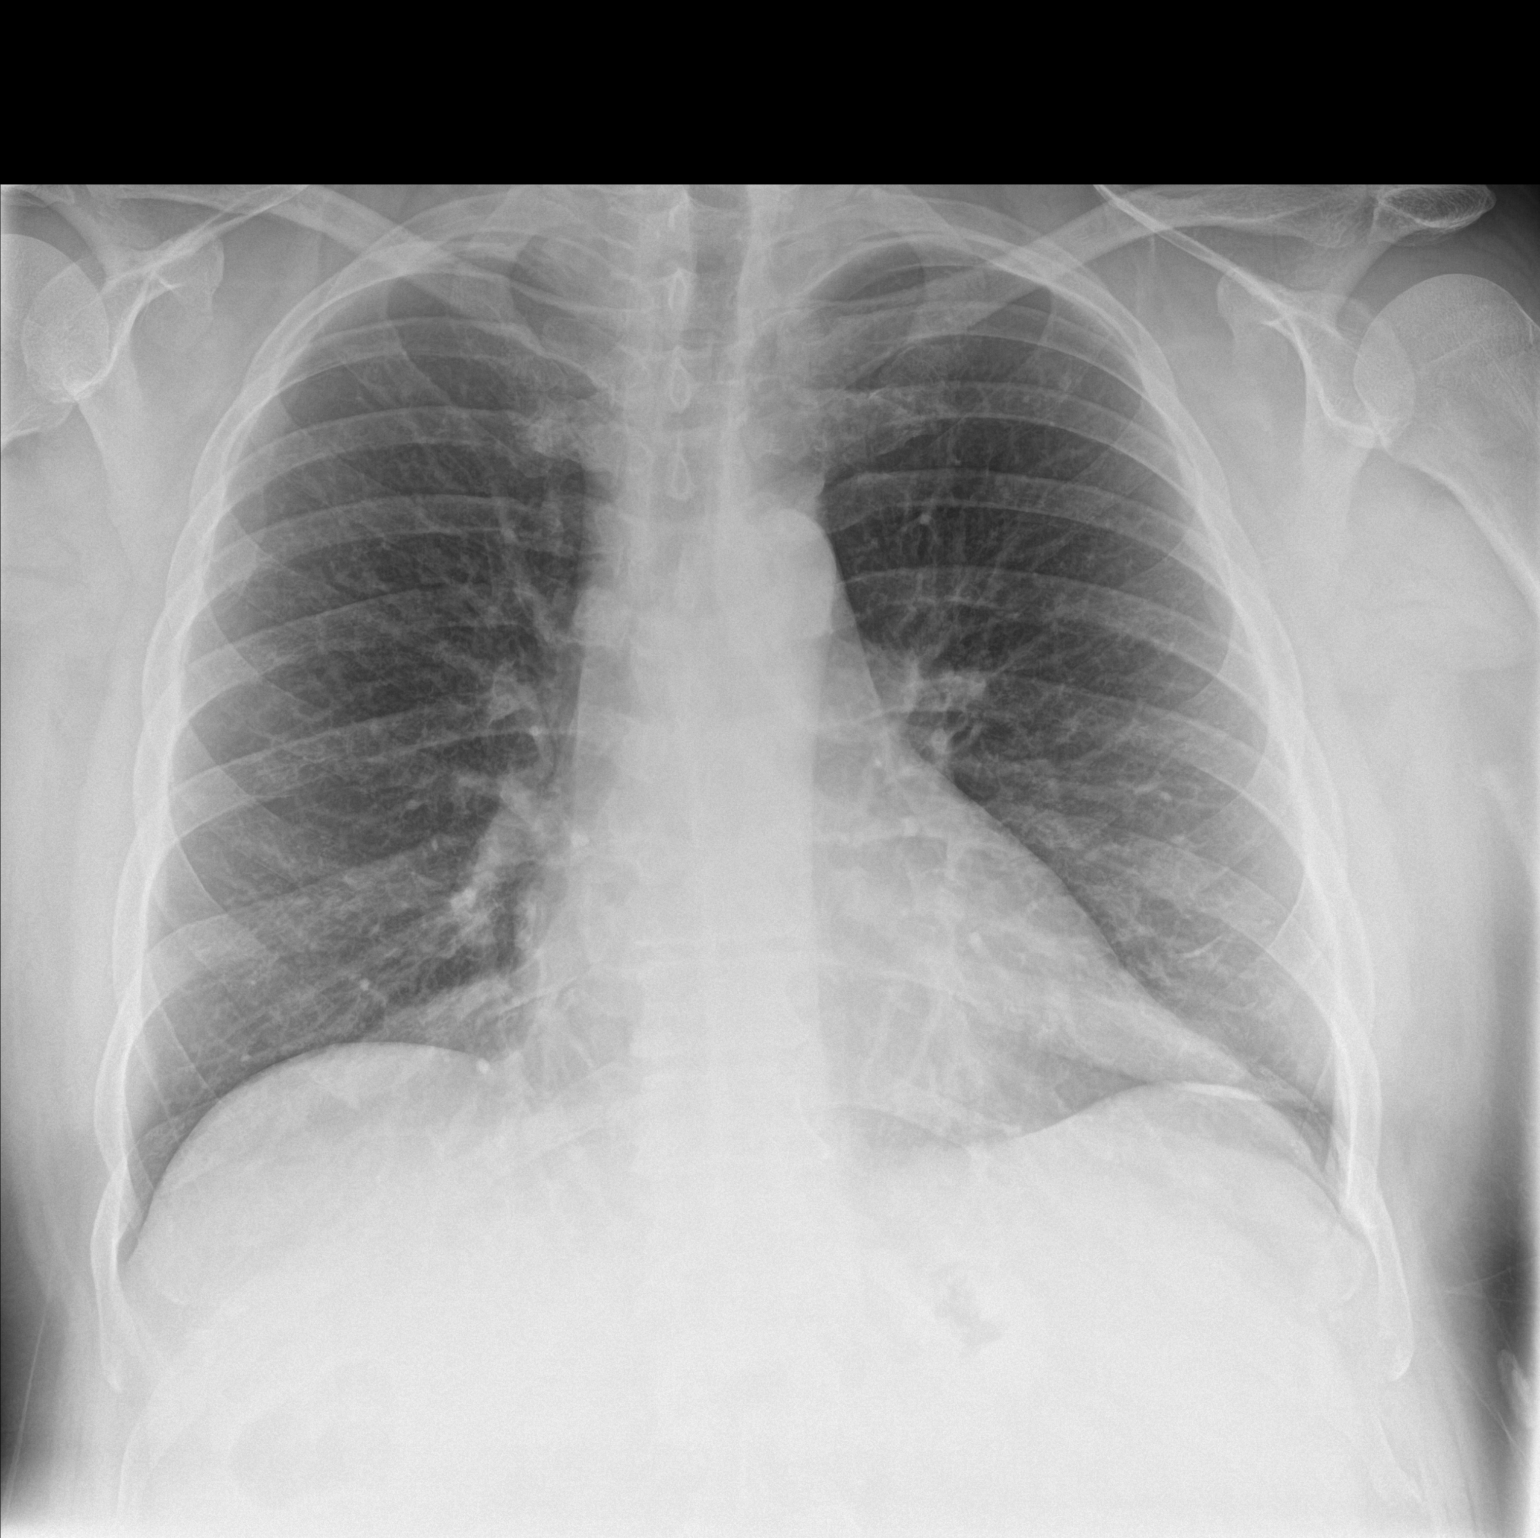

[chest lat]
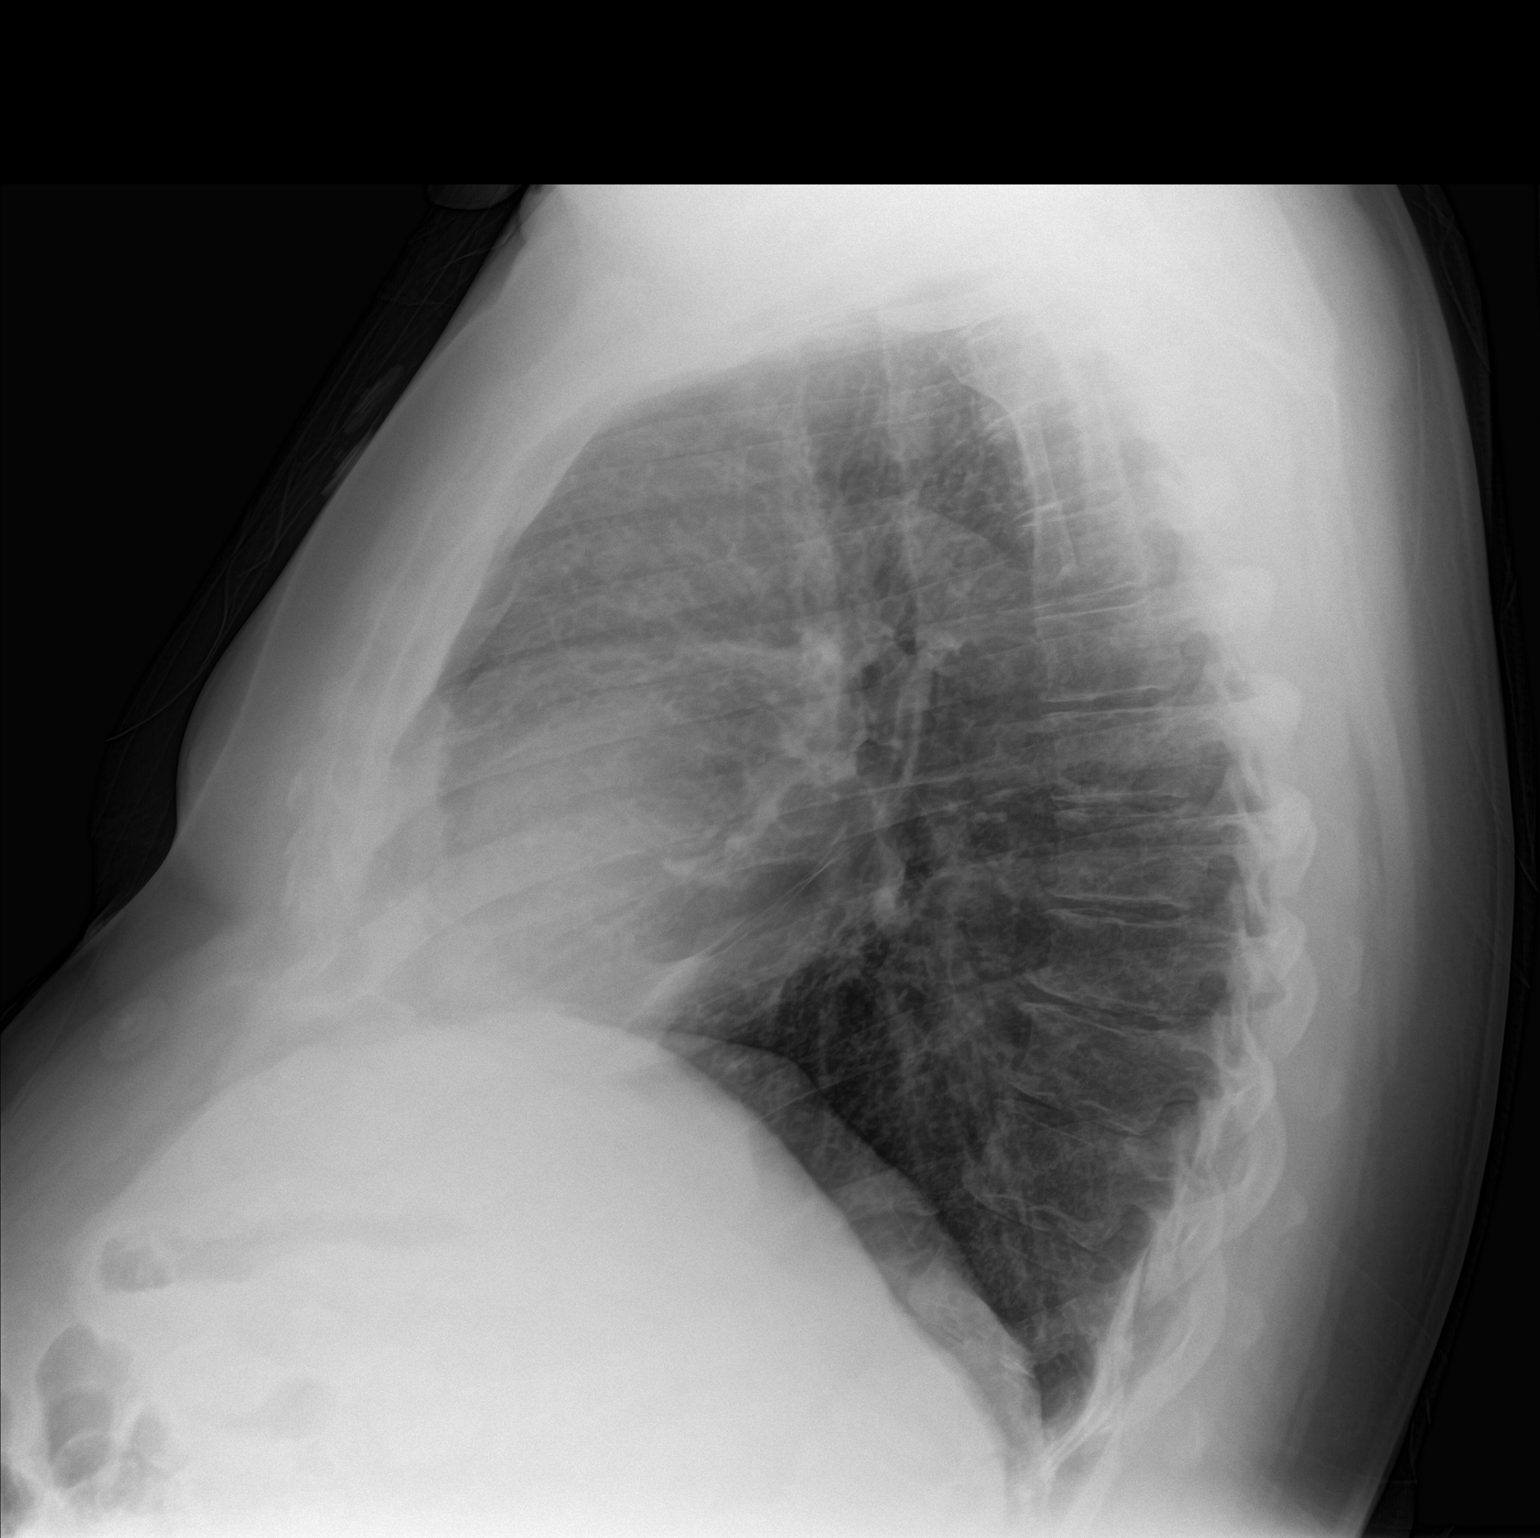

[2 of 2 positions shown; findings below may reference images not displayed]

FINDINGS: Borderline cardiomegaly with mild vascular congestion. No pulmonary
edema. Minimal scarring in the lingula. No confluent airspace
disease, pleural effusion or pneumothorax. Mild degenerative change
in the spine.
IMPRESSION: Borderline cardiomegaly with mild vascular congestion. No frank
pulmonary edema.

## 2016-07-11 IMAGING — CT CT ABD-PELV W/ CM
2 of 5 series · 8 of 46 positions shown, 9 images · IV contrast (omnipaque)
Comparison: None

CLINICAL DATA: RIGHT lower quadrant abdominal pain and swelling for
2 weeks getting worse, belching, former smoker

EXAM:
CT ABDOMEN AND PELVIS WITH CONTRAST
TECHNIQUE: Multidetector CT imaging of the abdomen and pelvis was performed
using the standard protocol following bolus administration of
intravenous contrast. Sagittal and coronal MPR images reconstructed
from axial data set.
CONTRAST:  100mL OMNIPAQUE IOHEXOL 300 MG/ML SOLN IV. No oral
contrast administered.

[Series 201: routine, idose (2) · axial · 0.87mm/px · z∈[+80,+500]mm · 5 of 108 slices shown, 6 images]
[im 12/108  soft-tissue]
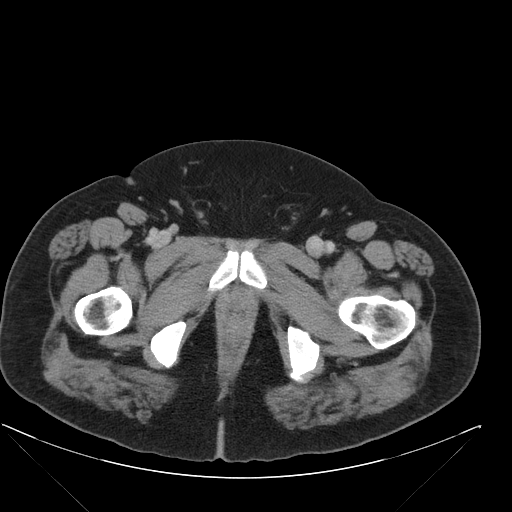
[im 12/108  bone]
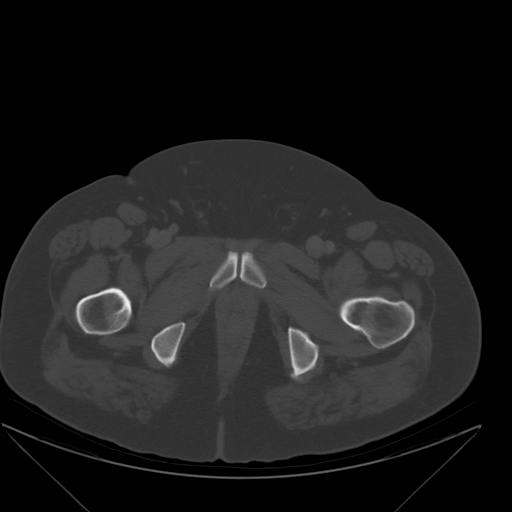
[im 36/108  soft-tissue]
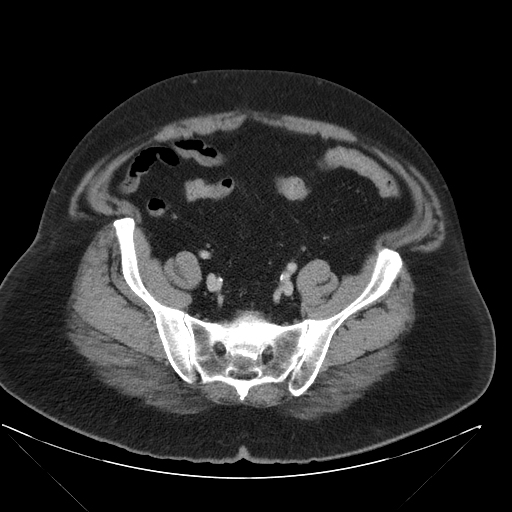
[im 54/108  soft-tissue]
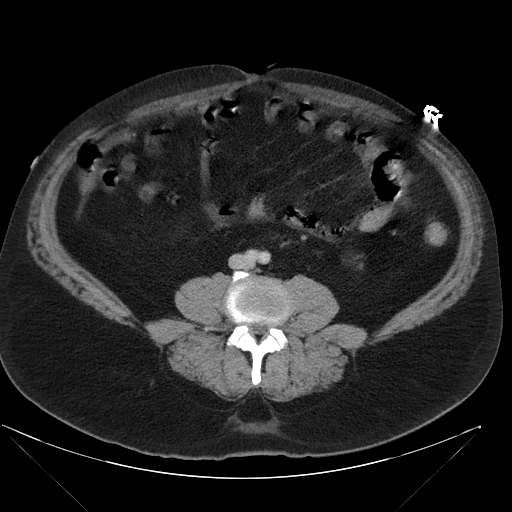
[im 72/108  soft-tissue]
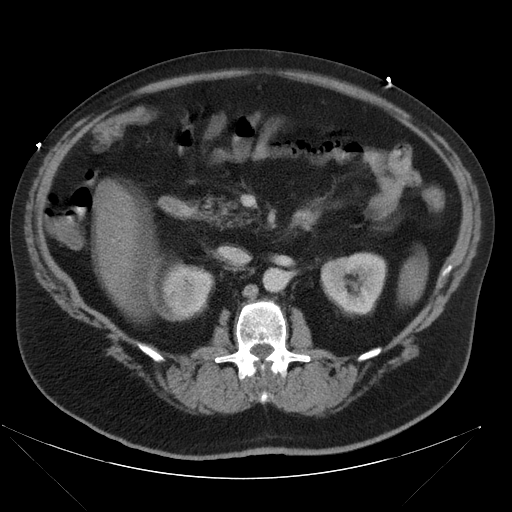
[im 96/108  soft-tissue]
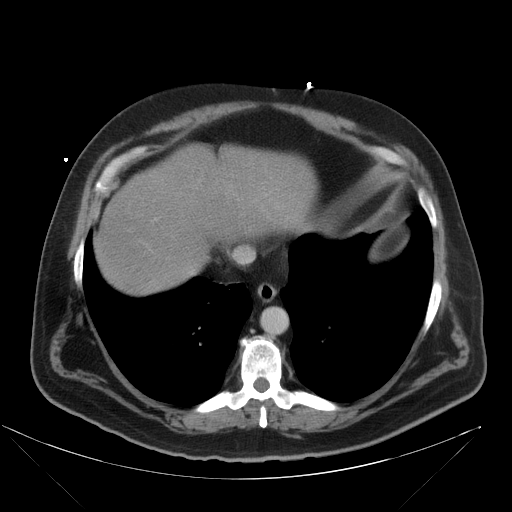

[Series 203: coronals, idose (2) · coronal · 0.45mm/px · 3 of 169 slices shown]
[im 57/169  soft-tissue]
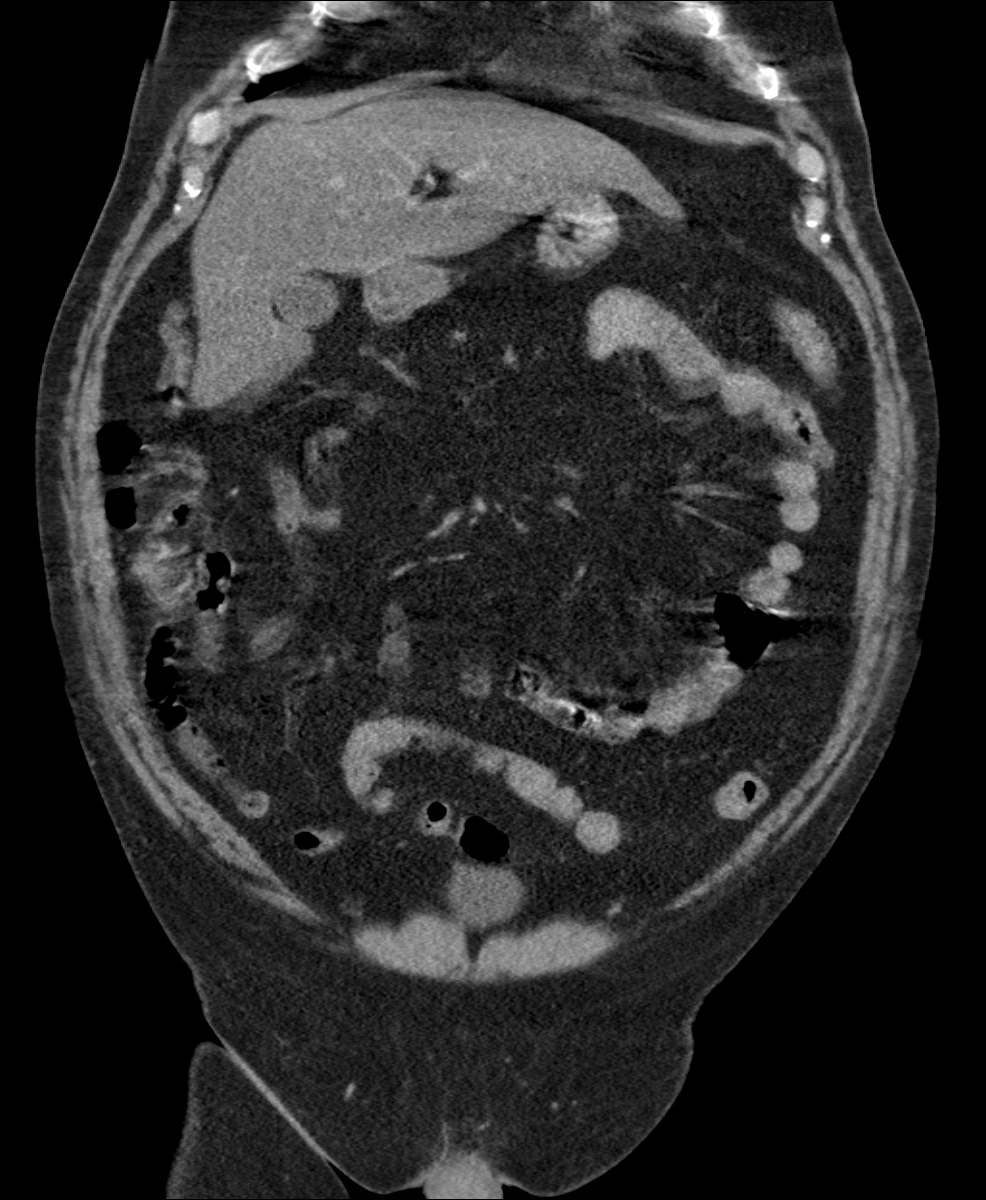
[im 75/169  soft-tissue]
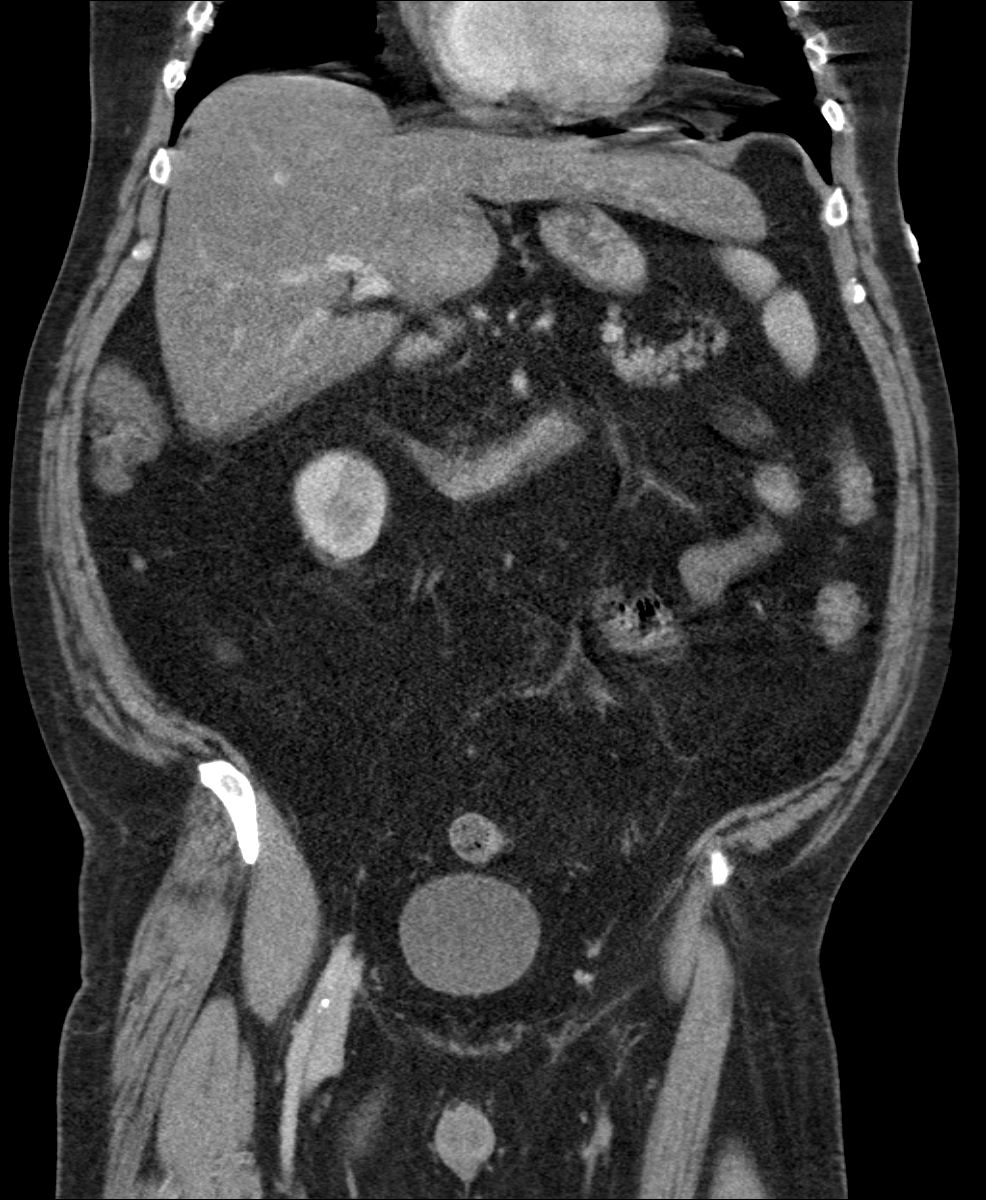
[im 94/169  soft-tissue]
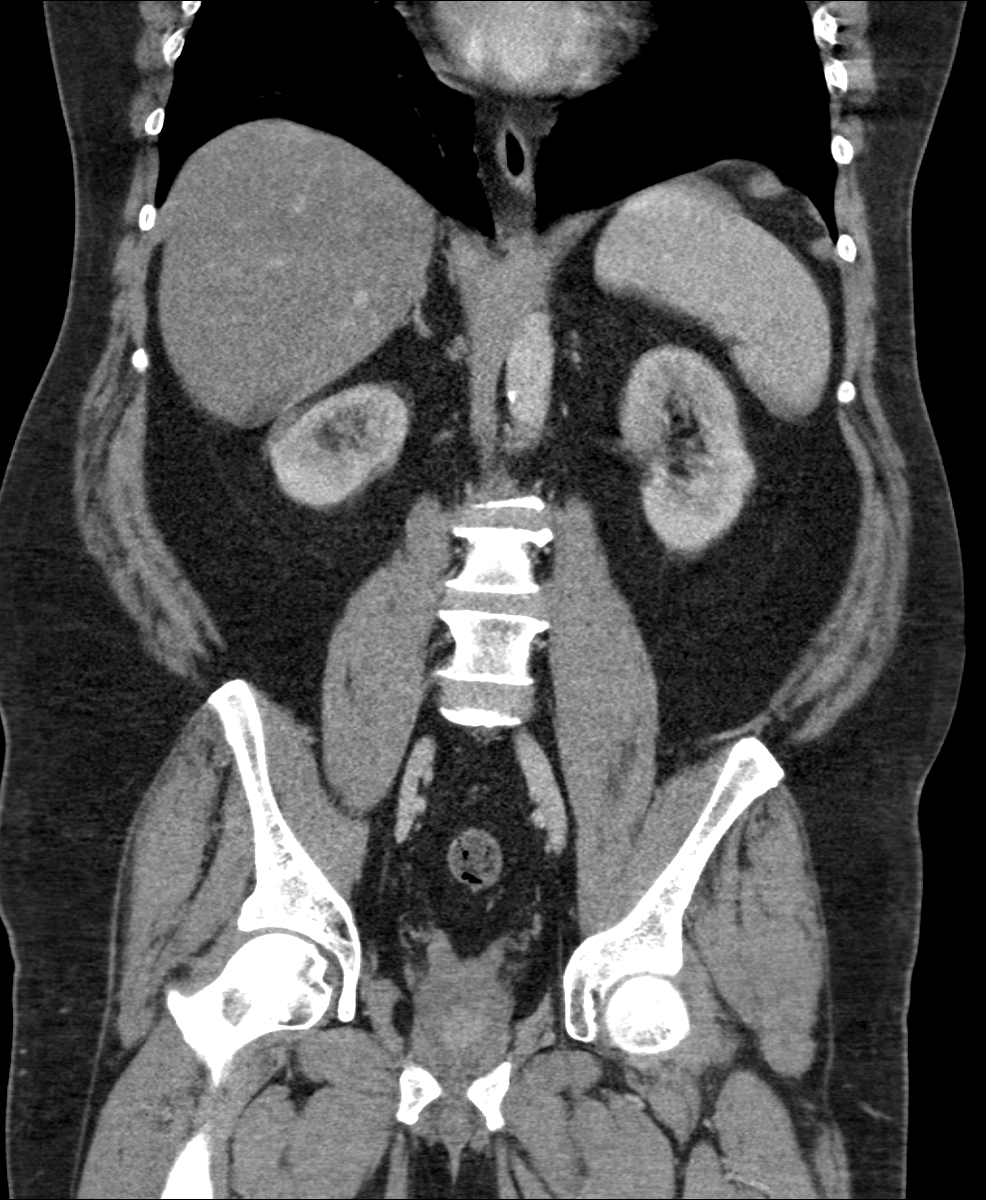

[8 of 46 positions shown; findings below may reference images not displayed]

FINDINGS: Motion artifacts at lung bases and throughout abdomen limit exam.

Questionable nodule 3 mm LEFT lower lobe image 13 and LEFT lower
lobe 6 mm diameter image 2.

Within limitations of motion, liver, gallbladder, spleen, pancreas,
and adrenal glands unremarkable.

Symmetric nephrograms with 10 x 10 mm low-attenuation lesion at
inferior pole LEFT kidney incompletely characterized.

Stomach decompressed.

Normal appendix.

Bowel loops suboptimally evaluated due to motion and lack of
opacification, no gross abnormalities identified.

Normal appearing bladder and ureters.

No mass, adenopathy, free air or free fluid.

Osseous structures unremarkable.

Question tiny LEFT inguinal hernia containing fat.
IMPRESSION: Nonspecific low-attenuation lesion inferior pole LEFT kidney 10 mm
diameter; due to body habitus and position/size of lesion this is
likely to be difficult to assess by ultrasound; recommend followup
MR characterization and assessment of stability in 6 months.

Question tiny bibasilar pulmonary nodules versus artifacts from
motion; recommendation below.

If the patient is at high risk for bronchogenic carcinoma, follow-up
chest CT at 6-12 months is recommended. If the patient is at low
risk for bronchogenic carcinoma, follow-up chest CT at 12 months is
recommended. This recommendation follows the consensus statement:
Guidelines for Management of Small Pulmonary Nodules Detected on CT
Scans: A Statement from the [HOSPITAL] as published in

## 2016-11-04 ENCOUNTER — Encounter: Payer: Self-pay | Admitting: *Deleted

## 2016-11-18 ENCOUNTER — Encounter: Payer: Self-pay | Admitting: Internal Medicine

## 2017-11-08 ENCOUNTER — Encounter (HOSPITAL_COMMUNITY): Payer: Self-pay | Admitting: Emergency Medicine

## 2017-11-08 ENCOUNTER — Emergency Department (HOSPITAL_COMMUNITY)
Admission: EM | Admit: 2017-11-08 | Discharge: 2017-11-08 | Disposition: A | Discharge status: Home / Self Care | Payer: Medicare Other | Attending: Emergency Medicine | Admitting: Emergency Medicine

## 2017-11-08 DIAGNOSIS — Z79899 Other long term (current) drug therapy: Secondary | ICD-10-CM | POA: Diagnosis not present

## 2017-11-08 DIAGNOSIS — Z87891 Personal history of nicotine dependence: Secondary | ICD-10-CM | POA: Diagnosis not present

## 2017-11-08 DIAGNOSIS — H5712 Ocular pain, left eye: Secondary | ICD-10-CM | POA: Diagnosis present

## 2017-11-08 DIAGNOSIS — H1032 Unspecified acute conjunctivitis, left eye: Secondary | ICD-10-CM

## 2017-11-08 DIAGNOSIS — I10 Essential (primary) hypertension: Secondary | ICD-10-CM | POA: Diagnosis not present

## 2017-11-08 HISTORY — DX: Essential (primary) hypertension: I10

## 2017-11-08 MED ORDER — OFLOXACIN 0.3 % OP SOLN
2.0000 [drp] | Freq: Four times a day (QID) | OPHTHALMIC | Status: DC
  Administered 2017-11-08: 2 [drp] via OPHTHALMIC
  Filled 2017-11-08: qty 5

## 2017-11-08 MED ORDER — OFLOXACIN 0.3 % OP SOLN: 1.0000 [drp] | Freq: Four times a day (QID) | OPHTHALMIC | 0 refills | Status: AC

## 2017-11-08 NOTE — ED Triage Notes (Signed)
Pt reports left eye pain, blurred vision, and yellow drainage since last night.

## 2017-11-08 NOTE — ED Provider Notes (Signed)
[facility] EMERGENCY DEPARTMENT Provider Note   CSN: [id number] Arrival date & time: 11/08/17  1848     History   Chief Complaint Chief Complaint  Patient presents with  . Eye Problem    HPI Duane Hancock is a 62 y.o. male.  The history is provided by the patient.  Eye Pain  This is a new problem. The current episode started 2 days ago. The problem occurs constantly. The problem has been gradually worsening. Pertinent negatives include no chest pain, no headaches and no shortness of breath. Nothing aggravates the symptoms. Nothing relieves the symptoms. He has tried nothing for the symptoms. The treatment provided no relief.    Past Medical History:  Diagnosis Date  . Depression   . Hypertension   . Schizophrenia ([place])   . Seasonal allergies     Patient Active Problem List   Diagnosis Date Noted  . SCHIZOPHRENIA 12/11/2006  . TOBACCO DEPENDENCE 12/11/2006  . GASTROESOPHAGEAL REFLUX, NO ESOPHAGITIS 12/11/2006    History reviewed. No pertinent surgical history.     Home Medications    Prior to Admission medications   Medication Sig Start Date End Date Taking? Authorizing Provider  acetaminophen (TYLENOL) 500 MG tablet Take 1,000 mg by mouth daily as needed for moderate pain.    [provider]  amitriptyline (ELAVIL) 25 MG tablet Take 150 mg by mouth at bedtime.     [provider]  diclofenac (VOLTAREN) 75 MG EC tablet Take 75 mg by mouth 2 (two) times daily as needed for mild pain.    [provider]  NEXIUM 40 MG capsule Take 1 capsule by mouth daily. 01/05/14   [provider]  ofloxacin (OCUFLOX) 0.3 % ophthalmic solution Place 1 drop into the left eye 4 (four) times daily for 7 days. 11/08/17 11/15/17  Rhea Thrun, Corene Cornea, MD  traZODone (DESYREL) 100 MG tablet Take 75 mg by mouth at bedtime.     [provider]  zolpidem (AMBIEN) 10 MG tablet Take 10 mg by mouth at bedtime as needed for sleep.    [provider]      Family History Family History  Problem Relation Age of Onset  . Colon cancer Father   . Esophageal cancer Neg Hx   . Stomach cancer Neg Hx   . Rectal cancer Neg Hx     Social History Social History   Tobacco Use  . Smoking status: Former Smoker    Last attempt to quit: 10/14/2010    Years since quitting: 7.0  . Smokeless tobacco: Never Used  Substance Use Topics  . Alcohol use: No  . Drug use: No     Allergies   Patient has no known allergies.   Review of Systems Review of Systems  Eyes: Positive for pain, discharge and redness.  Respiratory: Negative for shortness of breath.   Cardiovascular: Negative for chest pain.  Neurological: Negative for headaches.  All other systems reviewed and are negative.    Physical Exam Updated Vital Signs BP (!) 168/106 (BP Location: Right Arm)   Pulse (!) 108   Temp 98.2 F (36.8 C) (Oral)   Resp 18   Ht 6' (1.829 m)   Wt 117.9 kg (260 lb)   SpO2 100%   BMI 35.26 kg/m   Physical Exam  Constitutional: He appears well-developed and well-nourished.  HENT:  Head: Normocephalic and atraumatic.  Eyes: EOM are normal. Pupils are equal, round, and reactive to light. Left eye exhibits discharge (purulent and  green). Left conjunctiva is injected. Left conjunctiva has no hemorrhage.  Neck: Normal range of motion.  Cardiovascular: Normal rate.  Pulmonary/Chest: Effort normal. No respiratory distress.  Abdominal: Soft. He exhibits no distension.  Musculoskeletal: Normal range of motion.  Neurological: He is alert.  Skin: Skin is warm and dry.  Nursing note and vitals reviewed.    ED Treatments / Results  Labs (all labs ordered are listed, but only abnormal results are displayed) Labs Reviewed - No data to display  EKG  EKG Interpretation None       Radiology No results found.  Procedures Procedures (including critical care time)  Medications Ordered in ED Medications  ofloxacin (OCUFLOX) 0.3 % ophthalmic  solution 2 drop (not administered)     Initial Impression / Assessment and Plan / ED Course  I have reviewed the triage vital signs and the nursing notes.  Pertinent labs & imaging results that were available during my care of the patient were reviewed by me and considered in my medical decision making (see chart for details).     Suspect bacterial conjuncitivis. No vision changes (blurry vision but when he blinks a couple times it is normal). No other e/o complications. Will rx ofloxacin as he doesn't like ointment.  BP slightly high, no e/o end organ damage. Can fu w/ pcp for same.   Final Clinical Impressions(s) / ED Diagnoses   Final diagnoses:  Acute conjunctivitis of left eye, unspecified acute conjunctivitis type    ED Discharge Orders        Ordered    ofloxacin (OCUFLOX) 0.3 % ophthalmic solution  4 times daily     11/08/17 1912       Kinzey Sheriff, Corene Cornea, MD 11/08/17 1936

## 2017-12-04 ENCOUNTER — Other Ambulatory Visit (HOSPITAL_COMMUNITY): Payer: Self-pay | Admitting: Pharmacist

## 2018-11-06 DIAGNOSIS — K219 Gastro-esophageal reflux disease without esophagitis: Secondary | ICD-10-CM | POA: Diagnosis not present

## 2018-11-06 DIAGNOSIS — M189 Osteoarthritis of first carpometacarpal joint, unspecified: Secondary | ICD-10-CM | POA: Diagnosis not present

## 2018-11-06 DIAGNOSIS — Z0000 Encounter for general adult medical examination without abnormal findings: Secondary | ICD-10-CM | POA: Diagnosis not present

## 2018-11-06 DIAGNOSIS — J449 Chronic obstructive pulmonary disease, unspecified: Secondary | ICD-10-CM | POA: Diagnosis not present

## 2018-11-06 DIAGNOSIS — Z125 Encounter for screening for malignant neoplasm of prostate: Secondary | ICD-10-CM | POA: Diagnosis not present

## 2018-11-06 DIAGNOSIS — R7303 Prediabetes: Secondary | ICD-10-CM | POA: Diagnosis not present

## 2018-11-13 DIAGNOSIS — R69 Illness, unspecified: Secondary | ICD-10-CM | POA: Diagnosis not present

## 2018-11-13 DIAGNOSIS — F339 Major depressive disorder, recurrent, unspecified: Secondary | ICD-10-CM | POA: Diagnosis not present

## 2018-12-09 DIAGNOSIS — F339 Major depressive disorder, recurrent, unspecified: Secondary | ICD-10-CM | POA: Diagnosis not present

## 2018-12-09 DIAGNOSIS — R69 Illness, unspecified: Secondary | ICD-10-CM | POA: Diagnosis not present

## 2018-12-29 DIAGNOSIS — R69 Illness, unspecified: Secondary | ICD-10-CM | POA: Diagnosis not present

## 2018-12-29 DIAGNOSIS — F339 Major depressive disorder, recurrent, unspecified: Secondary | ICD-10-CM | POA: Diagnosis not present

## 2019-02-08 DIAGNOSIS — J449 Chronic obstructive pulmonary disease, unspecified: Secondary | ICD-10-CM | POA: Diagnosis not present

## 2019-02-08 DIAGNOSIS — R69 Illness, unspecified: Secondary | ICD-10-CM | POA: Diagnosis not present

## 2019-02-08 DIAGNOSIS — R7303 Prediabetes: Secondary | ICD-10-CM | POA: Diagnosis not present

## 2019-02-08 DIAGNOSIS — K219 Gastro-esophageal reflux disease without esophagitis: Secondary | ICD-10-CM | POA: Diagnosis not present

## 2019-05-31 DIAGNOSIS — K219 Gastro-esophageal reflux disease without esophagitis: Secondary | ICD-10-CM | POA: Diagnosis not present

## 2019-05-31 DIAGNOSIS — J449 Chronic obstructive pulmonary disease, unspecified: Secondary | ICD-10-CM | POA: Diagnosis not present

## 2019-05-31 DIAGNOSIS — R7303 Prediabetes: Secondary | ICD-10-CM | POA: Diagnosis not present

## 2019-05-31 DIAGNOSIS — R69 Illness, unspecified: Secondary | ICD-10-CM | POA: Diagnosis not present

## 2019-05-31 DIAGNOSIS — M189 Osteoarthritis of first carpometacarpal joint, unspecified: Secondary | ICD-10-CM | POA: Diagnosis not present

## 2019-08-30 DIAGNOSIS — Z23 Encounter for immunization: Secondary | ICD-10-CM | POA: Diagnosis not present

## 2019-08-30 DIAGNOSIS — R7303 Prediabetes: Secondary | ICD-10-CM | POA: Diagnosis not present

## 2019-08-30 DIAGNOSIS — I10 Essential (primary) hypertension: Secondary | ICD-10-CM | POA: Diagnosis not present

## 2019-08-30 DIAGNOSIS — J449 Chronic obstructive pulmonary disease, unspecified: Secondary | ICD-10-CM | POA: Diagnosis not present

## 2019-08-30 DIAGNOSIS — R69 Illness, unspecified: Secondary | ICD-10-CM | POA: Diagnosis not present

## 2019-10-25 DIAGNOSIS — R7303 Prediabetes: Secondary | ICD-10-CM | POA: Diagnosis not present

## 2019-10-25 DIAGNOSIS — E559 Vitamin D deficiency, unspecified: Secondary | ICD-10-CM | POA: Diagnosis not present

## 2019-10-25 DIAGNOSIS — Z125 Encounter for screening for malignant neoplasm of prostate: Secondary | ICD-10-CM | POA: Diagnosis not present

## 2019-10-25 DIAGNOSIS — F200 Paranoid schizophrenia: Secondary | ICD-10-CM | POA: Diagnosis not present

## 2019-10-25 DIAGNOSIS — I10 Essential (primary) hypertension: Secondary | ICD-10-CM | POA: Diagnosis not present

## 2019-10-25 DIAGNOSIS — R69 Illness, unspecified: Secondary | ICD-10-CM | POA: Diagnosis not present

## 2019-10-25 DIAGNOSIS — Z0000 Encounter for general adult medical examination without abnormal findings: Secondary | ICD-10-CM | POA: Diagnosis not present

## 2019-10-25 DIAGNOSIS — J449 Chronic obstructive pulmonary disease, unspecified: Secondary | ICD-10-CM | POA: Diagnosis not present

## 2019-12-06 DIAGNOSIS — J449 Chronic obstructive pulmonary disease, unspecified: Secondary | ICD-10-CM | POA: Diagnosis not present

## 2019-12-06 DIAGNOSIS — R7303 Prediabetes: Secondary | ICD-10-CM | POA: Diagnosis not present

## 2019-12-06 DIAGNOSIS — N139 Obstructive and reflux uropathy, unspecified: Secondary | ICD-10-CM | POA: Diagnosis not present

## 2019-12-06 DIAGNOSIS — R69 Illness, unspecified: Secondary | ICD-10-CM | POA: Diagnosis not present

## 2019-12-06 DIAGNOSIS — I10 Essential (primary) hypertension: Secondary | ICD-10-CM | POA: Diagnosis not present

## 2019-12-27 DIAGNOSIS — J449 Chronic obstructive pulmonary disease, unspecified: Secondary | ICD-10-CM | POA: Diagnosis not present

## 2019-12-27 DIAGNOSIS — I10 Essential (primary) hypertension: Secondary | ICD-10-CM | POA: Diagnosis not present

## 2019-12-27 DIAGNOSIS — L02426 Furuncle of left lower limb: Secondary | ICD-10-CM | POA: Diagnosis not present

## 2019-12-27 DIAGNOSIS — J302 Other seasonal allergic rhinitis: Secondary | ICD-10-CM | POA: Diagnosis not present

## 2019-12-27 DIAGNOSIS — B356 Tinea cruris: Secondary | ICD-10-CM | POA: Diagnosis not present

## 2019-12-28 DIAGNOSIS — I10 Essential (primary) hypertension: Secondary | ICD-10-CM | POA: Diagnosis not present

## 2019-12-28 DIAGNOSIS — E669 Obesity, unspecified: Secondary | ICD-10-CM | POA: Diagnosis not present

## 2019-12-28 DIAGNOSIS — G4700 Insomnia, unspecified: Secondary | ICD-10-CM | POA: Diagnosis not present

## 2019-12-28 DIAGNOSIS — J449 Chronic obstructive pulmonary disease, unspecified: Secondary | ICD-10-CM | POA: Diagnosis not present

## 2019-12-28 DIAGNOSIS — R69 Illness, unspecified: Secondary | ICD-10-CM | POA: Diagnosis not present

## 2019-12-28 DIAGNOSIS — G2581 Restless legs syndrome: Secondary | ICD-10-CM | POA: Diagnosis not present

## 2019-12-28 DIAGNOSIS — E785 Hyperlipidemia, unspecified: Secondary | ICD-10-CM | POA: Diagnosis not present

## 2020-02-07 DIAGNOSIS — J449 Chronic obstructive pulmonary disease, unspecified: Secondary | ICD-10-CM | POA: Diagnosis not present

## 2020-02-07 DIAGNOSIS — M189 Osteoarthritis of first carpometacarpal joint, unspecified: Secondary | ICD-10-CM | POA: Diagnosis not present

## 2020-02-07 DIAGNOSIS — R69 Illness, unspecified: Secondary | ICD-10-CM | POA: Diagnosis not present

## 2020-02-07 DIAGNOSIS — R7303 Prediabetes: Secondary | ICD-10-CM | POA: Diagnosis not present

## 2020-02-07 DIAGNOSIS — I10 Essential (primary) hypertension: Secondary | ICD-10-CM | POA: Diagnosis not present

## 2020-05-08 DIAGNOSIS — E669 Obesity, unspecified: Secondary | ICD-10-CM | POA: Diagnosis not present

## 2020-05-08 DIAGNOSIS — J449 Chronic obstructive pulmonary disease, unspecified: Secondary | ICD-10-CM | POA: Diagnosis not present

## 2020-05-08 DIAGNOSIS — R7303 Prediabetes: Secondary | ICD-10-CM | POA: Diagnosis not present

## 2020-05-08 DIAGNOSIS — J302 Other seasonal allergic rhinitis: Secondary | ICD-10-CM | POA: Diagnosis not present

## 2020-05-08 DIAGNOSIS — I10 Essential (primary) hypertension: Secondary | ICD-10-CM | POA: Diagnosis not present

## 2020-05-08 DIAGNOSIS — K219 Gastro-esophageal reflux disease without esophagitis: Secondary | ICD-10-CM | POA: Diagnosis not present

## 2020-08-07 DIAGNOSIS — R7303 Prediabetes: Secondary | ICD-10-CM | POA: Diagnosis not present

## 2020-08-07 DIAGNOSIS — M189 Osteoarthritis of first carpometacarpal joint, unspecified: Secondary | ICD-10-CM | POA: Diagnosis not present

## 2020-08-07 DIAGNOSIS — Z23 Encounter for immunization: Secondary | ICD-10-CM | POA: Diagnosis not present

## 2020-08-07 DIAGNOSIS — I10 Essential (primary) hypertension: Secondary | ICD-10-CM | POA: Diagnosis not present

## 2020-08-07 DIAGNOSIS — J302 Other seasonal allergic rhinitis: Secondary | ICD-10-CM | POA: Diagnosis not present

## 2020-08-07 DIAGNOSIS — K219 Gastro-esophageal reflux disease without esophagitis: Secondary | ICD-10-CM | POA: Diagnosis not present

## 2020-08-07 DIAGNOSIS — J449 Chronic obstructive pulmonary disease, unspecified: Secondary | ICD-10-CM | POA: Diagnosis not present

## 2020-08-07 DIAGNOSIS — G2581 Restless legs syndrome: Secondary | ICD-10-CM | POA: Diagnosis not present

## 2020-11-20 ENCOUNTER — Other Ambulatory Visit: Payer: Self-pay | Admitting: Internal Medicine

## 2020-11-20 DIAGNOSIS — I10 Essential (primary) hypertension: Secondary | ICD-10-CM | POA: Diagnosis not present

## 2020-11-20 DIAGNOSIS — J209 Acute bronchitis, unspecified: Secondary | ICD-10-CM | POA: Diagnosis not present

## 2020-11-20 DIAGNOSIS — Z0000 Encounter for general adult medical examination without abnormal findings: Secondary | ICD-10-CM | POA: Diagnosis not present

## 2020-11-20 DIAGNOSIS — Z20822 Contact with and (suspected) exposure to covid-19: Secondary | ICD-10-CM | POA: Diagnosis not present

## 2020-11-20 DIAGNOSIS — R7303 Prediabetes: Secondary | ICD-10-CM | POA: Diagnosis not present

## 2020-11-20 DIAGNOSIS — E559 Vitamin D deficiency, unspecified: Secondary | ICD-10-CM | POA: Diagnosis not present

## 2020-11-20 DIAGNOSIS — J449 Chronic obstructive pulmonary disease, unspecified: Secondary | ICD-10-CM | POA: Diagnosis not present

## 2020-11-20 DIAGNOSIS — R69 Illness, unspecified: Secondary | ICD-10-CM | POA: Diagnosis not present

## 2020-11-20 DIAGNOSIS — Z20828 Contact with and (suspected) exposure to other viral communicable diseases: Secondary | ICD-10-CM | POA: Diagnosis not present

## 2020-11-20 DIAGNOSIS — Z125 Encounter for screening for malignant neoplasm of prostate: Secondary | ICD-10-CM | POA: Diagnosis not present

## 2020-11-22 LAB — COMPLETE METABOLIC PANEL WITH GFR
AG Ratio: 1.3 (calc) (ref 1.0–2.5)
ALT: 23 U/L (ref 9–46)
AST: 20 U/L (ref 10–35)
Albumin: 3.9 g/dL (ref 3.6–5.1)
Alkaline phosphatase (APISO): 91 U/L (ref 35–144)
BUN: 11 mg/dL (ref 7–25)
CO2: 26 mmol/L (ref 20–32)
Calcium: 9 mg/dL (ref 8.6–10.3)
Chloride: 104 mmol/L (ref 98–110)
Creat: 1.1 mg/dL (ref 0.70–1.25)
GFR, Est African American: 83 mL/min/{1.73_m2} (ref >=60)
GFR, Est Non African American: 72 mL/min/{1.73_m2} (ref >=60)
Globulin: 3.1 g/dL (calc) (ref 1.9–3.7)
Glucose, Bld: 131 mg/dL — ABNORMAL HIGH (ref 65–99)
Potassium: 3.7 mmol/L (ref 3.5–5.3)
Sodium: 138 mmol/L (ref 135–146)
Total Bilirubin: 0.8 mg/dL (ref 0.2–1.2)
Total Protein: 7 g/dL (ref 6.1–8.1)

## 2020-11-22 LAB — CBC
HCT: 43 % (ref 38.5–50.0)
Hemoglobin: 14.6 g/dL (ref 13.2–17.1)
MCH: 29.6 pg (ref 27.0–33.0)
MCHC: 34 g/dL (ref 32.0–36.0)
MCV: 87 fL (ref 80.0–100.0)
MPV: 11.5 fL (ref 7.5–12.5)
Platelets: 146 10*3/uL (ref 140–400)
RBC: 4.94 10*6/uL (ref 4.20–5.80)
RDW: 12.9 % (ref 11.0–15.0)
WBC: 4.5 10*3/uL (ref 3.8–10.8)

## 2020-11-22 LAB — LIPID PANEL
Cholesterol: 105 mg/dL (ref <200)
HDL: 28 mg/dL — ABNORMAL LOW (ref >=40)
LDL Cholesterol (Calc): 58 mg/dL (calc)
Non-HDL Cholesterol (Calc): 77 mg/dL (calc) (ref <130)
Total CHOL/HDL Ratio: 3.8 (calc) (ref <5.0)
Triglycerides: 111 mg/dL (ref <150)

## 2020-11-22 LAB — PSA: PSA: 0.45 ng/mL (ref <=4.0)

## 2020-11-22 LAB — VITAMIN D 25 HYDROXY (VIT D DEFICIENCY, FRACTURES): Vit D, 25-Hydroxy: 29 ng/mL — ABNORMAL LOW (ref 30–100)

## 2020-11-22 LAB — SARS-COV-2 RNA,(COVID-19) QUALITATIVE NAAT: SARS CoV2 RNA: NOT DETECTED

## 2020-11-22 LAB — TSH: TSH: 0.9 mIU/L (ref 0.40–4.50)

## 2020-12-01 DIAGNOSIS — I10 Essential (primary) hypertension: Secondary | ICD-10-CM | POA: Diagnosis not present

## 2020-12-01 DIAGNOSIS — R69 Illness, unspecified: Secondary | ICD-10-CM | POA: Diagnosis not present

## 2020-12-01 DIAGNOSIS — J449 Chronic obstructive pulmonary disease, unspecified: Secondary | ICD-10-CM | POA: Diagnosis not present

## 2020-12-01 DIAGNOSIS — R7303 Prediabetes: Secondary | ICD-10-CM | POA: Diagnosis not present

## 2021-03-02 DIAGNOSIS — I10 Essential (primary) hypertension: Secondary | ICD-10-CM | POA: Diagnosis not present

## 2021-03-02 DIAGNOSIS — R69 Illness, unspecified: Secondary | ICD-10-CM | POA: Diagnosis not present

## 2021-03-02 DIAGNOSIS — J302 Other seasonal allergic rhinitis: Secondary | ICD-10-CM | POA: Diagnosis not present

## 2021-03-02 DIAGNOSIS — M189 Osteoarthritis of first carpometacarpal joint, unspecified: Secondary | ICD-10-CM | POA: Diagnosis not present

## 2021-03-02 DIAGNOSIS — J449 Chronic obstructive pulmonary disease, unspecified: Secondary | ICD-10-CM | POA: Diagnosis not present

## 2021-03-02 DIAGNOSIS — N139 Obstructive and reflux uropathy, unspecified: Secondary | ICD-10-CM | POA: Diagnosis not present

## 2021-03-02 DIAGNOSIS — E1165 Type 2 diabetes mellitus with hyperglycemia: Secondary | ICD-10-CM | POA: Diagnosis not present

## 2021-03-19 DIAGNOSIS — E1165 Type 2 diabetes mellitus with hyperglycemia: Secondary | ICD-10-CM | POA: Diagnosis not present

## 2021-03-19 DIAGNOSIS — J449 Chronic obstructive pulmonary disease, unspecified: Secondary | ICD-10-CM | POA: Diagnosis not present

## 2021-03-19 DIAGNOSIS — R69 Illness, unspecified: Secondary | ICD-10-CM | POA: Diagnosis not present

## 2021-03-19 DIAGNOSIS — I10 Essential (primary) hypertension: Secondary | ICD-10-CM | POA: Diagnosis not present

## 2021-04-23 DIAGNOSIS — J449 Chronic obstructive pulmonary disease, unspecified: Secondary | ICD-10-CM | POA: Diagnosis not present

## 2021-04-23 DIAGNOSIS — I10 Essential (primary) hypertension: Secondary | ICD-10-CM | POA: Diagnosis not present

## 2021-04-23 DIAGNOSIS — E1165 Type 2 diabetes mellitus with hyperglycemia: Secondary | ICD-10-CM | POA: Diagnosis not present

## 2021-04-23 DIAGNOSIS — R69 Illness, unspecified: Secondary | ICD-10-CM | POA: Diagnosis not present

## 2021-04-23 DIAGNOSIS — N139 Obstructive and reflux uropathy, unspecified: Secondary | ICD-10-CM | POA: Diagnosis not present

## 2021-06-04 DIAGNOSIS — K219 Gastro-esophageal reflux disease without esophagitis: Secondary | ICD-10-CM | POA: Diagnosis not present

## 2021-06-04 DIAGNOSIS — J449 Chronic obstructive pulmonary disease, unspecified: Secondary | ICD-10-CM | POA: Diagnosis not present

## 2021-06-04 DIAGNOSIS — E1165 Type 2 diabetes mellitus with hyperglycemia: Secondary | ICD-10-CM | POA: Diagnosis not present

## 2021-06-04 DIAGNOSIS — I10 Essential (primary) hypertension: Secondary | ICD-10-CM | POA: Diagnosis not present

## 2021-09-21 DIAGNOSIS — D120 Benign neoplasm of cecum: Secondary | ICD-10-CM | POA: Diagnosis not present

## 2021-09-21 DIAGNOSIS — J449 Chronic obstructive pulmonary disease, unspecified: Secondary | ICD-10-CM | POA: Diagnosis not present

## 2021-09-21 DIAGNOSIS — R69 Illness, unspecified: Secondary | ICD-10-CM | POA: Diagnosis not present

## 2021-09-21 DIAGNOSIS — Z23 Encounter for immunization: Secondary | ICD-10-CM | POA: Diagnosis not present

## 2021-09-21 DIAGNOSIS — I10 Essential (primary) hypertension: Secondary | ICD-10-CM | POA: Diagnosis not present

## 2021-09-21 DIAGNOSIS — E1165 Type 2 diabetes mellitus with hyperglycemia: Secondary | ICD-10-CM | POA: Diagnosis not present

## 2021-12-17 ENCOUNTER — Other Ambulatory Visit: Payer: Self-pay | Admitting: Internal Medicine

## 2021-12-18 LAB — CBC
HCT: 41.4 % (ref 38.5–50.0)
Hemoglobin: 13.8 g/dL (ref 13.2–17.1)
MCH: 29.8 pg (ref 27.0–33.0)
MCHC: 33.3 g/dL (ref 32.0–36.0)
MCV: 89.4 fL (ref 80.0–100.0)
MPV: 11.3 fL (ref 7.5–12.5)
Platelets: 153 10*3/uL (ref 140–400)
RBC: 4.63 10*6/uL (ref 4.20–5.80)
RDW: 12.3 % (ref 11.0–15.0)
WBC: 7.4 10*3/uL (ref 3.8–10.8)

## 2021-12-18 LAB — COMPLETE METABOLIC PANEL WITH GFR
AG Ratio: 1.5 (calc) (ref 1.0–2.5)
ALT: 32 U/L (ref 9–46)
AST: 24 U/L (ref 10–35)
Albumin: 4.4 g/dL (ref 3.6–5.1)
Alkaline phosphatase (APISO): 63 U/L (ref 35–144)
BUN: 19 mg/dL (ref 7–25)
CO2: 25 mmol/L (ref 20–32)
Calcium: 9.3 mg/dL (ref 8.6–10.3)
Chloride: 106 mmol/L (ref 98–110)
Creat: 1.02 mg/dL (ref 0.70–1.35)
Globulin: 3 g/dL (calc) (ref 1.9–3.7)
Glucose, Bld: 148 mg/dL — ABNORMAL HIGH (ref 65–99)
Potassium: 4 mmol/L (ref 3.5–5.3)
Sodium: 140 mmol/L (ref 135–146)
Total Bilirubin: 0.5 mg/dL (ref 0.2–1.2)
Total Protein: 7.4 g/dL (ref 6.1–8.1)
eGFR: 83 mL/min/{1.73_m2} (ref >=60)

## 2021-12-18 LAB — VITAMIN D 25 HYDROXY (VIT D DEFICIENCY, FRACTURES): Vit D, 25-Hydroxy: 32 ng/mL (ref 30–100)

## 2021-12-18 LAB — LIPID PANEL
Cholesterol: 105 mg/dL (ref <200)
HDL: 38 mg/dL — ABNORMAL LOW (ref >=40)
LDL Cholesterol (Calc): 44 mg/dL (calc)
Non-HDL Cholesterol (Calc): 67 mg/dL (calc) (ref <130)
Total CHOL/HDL Ratio: 2.8 (calc) (ref <5.0)
Triglycerides: 145 mg/dL (ref <150)

## 2021-12-18 LAB — TSH: TSH: 1.02 mIU/L (ref 0.40–4.50)

## 2021-12-18 LAB — PSA: PSA: 0.56 ng/mL (ref <=4.00)

## 2023-05-17 ENCOUNTER — Emergency Department (HOSPITAL_BASED_OUTPATIENT_CLINIC_OR_DEPARTMENT_OTHER): Payer: 59

## 2023-05-17 ENCOUNTER — Other Ambulatory Visit: Payer: Self-pay

## 2023-05-17 ENCOUNTER — Encounter (HOSPITAL_BASED_OUTPATIENT_CLINIC_OR_DEPARTMENT_OTHER): Payer: Self-pay

## 2023-05-17 ENCOUNTER — Emergency Department (HOSPITAL_BASED_OUTPATIENT_CLINIC_OR_DEPARTMENT_OTHER)
Admission: EM | Admit: 2023-05-17 | Discharge: 2023-05-17 | Disposition: A | Discharge status: Home / Self Care | Payer: 59 | Source: Home / Self Care | Attending: Emergency Medicine | Admitting: Emergency Medicine

## 2023-05-17 DIAGNOSIS — S82852A Displaced trimalleolar fracture of left lower leg, initial encounter for closed fracture: Secondary | ICD-10-CM | POA: Diagnosis not present

## 2023-05-17 DIAGNOSIS — X509XXA Other and unspecified overexertion or strenuous movements or postures, initial encounter: Secondary | ICD-10-CM | POA: Insufficient documentation

## 2023-05-17 DIAGNOSIS — S82832A Other fracture of upper and lower end of left fibula, initial encounter for closed fracture: Secondary | ICD-10-CM | POA: Insufficient documentation

## 2023-05-17 DIAGNOSIS — S9305XA Dislocation of left ankle joint, initial encounter: Secondary | ICD-10-CM | POA: Insufficient documentation

## 2023-05-17 MED ORDER — OXYCODONE-ACETAMINOPHEN 5-325 MG PO TABS: 1.0000 | ORAL_TABLET | Freq: Four times a day (QID) | ORAL | 0 refills | Status: AC | PRN

## 2023-05-17 MED ORDER — FENTANYL CITRATE PF 50 MCG/ML IJ SOSY
100.0000 ug | PREFILLED_SYRINGE | Freq: Once | INTRAMUSCULAR | Status: AC
  Administered 2023-05-17: 100 ug via INTRAVENOUS
  Filled 2023-05-17: qty 2

## 2023-05-17 MED ORDER — PROPOFOL 10 MG/ML IV BOLUS
1.0000 mg/kg | Freq: Once | INTRAVENOUS | Status: AC
  Administered 2023-05-17: 50 mg via INTRAVENOUS
  Filled 2023-05-17: qty 20

## 2023-05-17 NOTE — ED Notes (Signed)
RT discontinued pts oxygen at this time. Pt respiratory status is stable on RA w/no distress noted. Pt BLBS clr/dim w/sats of 99%.   05/17/23 2139  Vitals  Pulse Rate 86  ECG Heart Rate 89  Resp 18  Respiratory Pattern Regular;Unlabored;Symmetrical  SpO2 100 %  Oxygen Therapy  O2 Device (S)  Room Air  FiO2 (%) (S)  21 %  Patient Activity (if Appropriate) In bed  Pulse Oximetry Type Continuous  End [place] CO2 (EtCO2) 34

## 2023-05-17 NOTE — ED Notes (Signed)
RT assessed pt post procedural/conscious sedation for left ankle reduction. Pt respiratory status remained stable throughout procedure on ETCO2 [place] 4 Lpm w/no distress. RT will titrate oxygen down as sedation wears off. RT will continue to monitor while at [facility].   05/17/23 2053  Therapy Vitals  Patient Position (if appropriate) Sitting  Respiratory Assessment  Assessment Type Assess only  Respiratory Pattern Regular;Unlabored;Symmetrical  Chest Assessment Chest expansion symmetrical  Cough None  Bilateral Breath Sounds Clear;Diminished  R Upper  Breath Sounds Clear  L Upper Breath Sounds Clear;Diminished  R Lower Breath Sounds Diminished  L Lower Breath Sounds Diminished  Oxygen Therapy/Pulse Ox  O2 Device Nasal Cannula  O2 Therapy Oxygen  O2 Flow Rate (L/min) 4 L/min  FiO2 (%) 36 %

## 2023-05-17 NOTE — ED Notes (Signed)
Xray at bedside to confirm placement. Pt alert and appears comfortable, denying pain, family back at bedside.

## 2023-05-17 NOTE — ED Provider Notes (Signed)
[place] [facility] AT [facility] Provider Note   CSN: [id number] Arrival date & time: 05/17/23  1921     History  Chief Complaint  Patient presents with   Foot Injury    Duane Hancock is a 67 y.o. male with past medical history significant for tobacco dependence, depression, hypertension, schizophrenia who presents with concern after twisting ankle while trying to get out of his truck last night.  Patient endorses significant pain of left ankle.  Hematoma, blisters noted to left ankle but sensation, pulses intact.  He reports that he did not have anyone to help take him to the emergency department but he tried to walk on the ankle this morning and was not able to.   Foot Injury      Home Medications Prior to Admission medications   Medication Sig Start Date End Date Taking? Authorizing Provider  oxyCODONE-acetaminophen (PERCOCET/ROXICET) 5-325 MG tablet Take 1 tablet by mouth every 6 (six) hours as needed for severe pain. 05/17/23  Yes ,  H, PA-C  acetaminophen (TYLENOL) 500 MG tablet Take 1,000 mg by mouth daily as needed for moderate pain.    [provider]  amitriptyline (ELAVIL) 25 MG tablet Take 150 mg by mouth at bedtime.     [provider]  diclofenac (VOLTAREN) 75 MG EC tablet Take 75 mg by mouth 2 (two) times daily as needed for mild pain.    [provider]  NEXIUM 40 MG capsule Take 1 capsule by mouth daily. 01/05/14   [provider]  traZODone (DESYREL) 100 MG tablet Take 75 mg by mouth at bedtime.     [provider]  zolpidem (AMBIEN) 10 MG tablet Take 10 mg by mouth at bedtime as needed for sleep.    [provider]      Allergies    Patient has no known allergies.    Review of Systems   Review of Systems  All other systems reviewed and are negative.   Physical Exam Updated Vital Signs BP 134/86   Pulse 95   Temp 98.3 F (36.8 C) (Oral)   Resp 17   Ht 6'  (1.829 m)   Wt 108.9 kg   SpO2 98%   BMI 32.55 kg/m  Physical Exam Vitals and nursing note reviewed.  Constitutional:      General: He is not in acute distress.    Appearance: Normal appearance.  HENT:     Head: Normocephalic and atraumatic.  Eyes:     General:        Right eye: No discharge.        Left eye: No discharge.  Cardiovascular:     Rate and Rhythm: Normal rate and regular rhythm.     Pulses: Normal pulses.     Comments: DP, PT pulses 2+, strong in the affected left lower extremity Pulmonary:     Effort: Pulmonary effort is normal. No respiratory distress.  Musculoskeletal:        General: Swelling and deformity present.     Comments: Swelling, deformity, bruising, and blood-filled blisters noted to left ankle, worst on the medial side.  Step-off of the distal fibula palpated.  No open fracture noted.  Skin:    General: Skin is warm and dry.     Capillary Refill: Capillary refill takes less than 2 seconds.  Neurological:     Mental Status: He is alert and oriented to person, place, and time.  Psychiatric:  Mood and Affect: Mood normal.        Behavior: Behavior normal.     ED Results / Procedures / Treatments   Labs (all labs ordered are listed, but only abnormal results are displayed) Labs Reviewed - No data to display  EKG None  Radiology DG Ankle 2 Views Left  Result Date: 05/17/2023 CLINICAL DATA:  Postreduction EXAM: LEFT ANKLE - 2 VIEW COMPARISON:  Earlier today FINDINGS: In cast views of the left ankle demonstrate interval reduction of the previously seen dislocation at the left ankle and previously seen displaced fractures. Anatomic alignment. IMPRESSION: Interval reduction of the previously seen fracture dislocation. Electronically Signed   By: Charlett Nose M.D.   On: 05/17/2023 21:13   DG Ankle Complete Left  Result Date: 05/17/2023 CLINICAL DATA:  Twisted ankle while trying to get out of truck last night. EXAM: LEFT ANKLE COMPLETE - 3+ VIEW  COMPARISON:  None Available. FINDINGS: There is lateral dislocation of the talus with respect to the distal tibial plafond. There is an oblique fracture within the distal fibular metadiaphysis just superior to the tibiotalar joint with approximately 7 mm lateral and 6 mm posterior displacement of the distal fracture component with respect to the proximal fracture component. There is lucency at the far posterior/superficial aspect of the posterior malleolus suspicious for an acute minimally displaced fracture. Small plantar and posterior calcaneal heel spurs. IMPRESSION: 1. Lateral dislocation of the talus with respect to the distal tibial plafond. 2. Acute oblique fracture within the distal fibular metadiaphysis just superior to the tibiotalar joint. 3. Suspected acute minimally displaced fracture at the far posterior/superficial aspect of the posterior malleolus. Electronically Signed   By: Neita Garnet M.D.   On: 05/17/2023 20:13    Procedures Reduction of dislocation  Date/Time: 05/17/2023 9:38 PM  Performed by: Olene Floss, PA-C Authorized by: Olene Floss, PA-C  Consent: Verbal consent obtained. Written consent obtained. Risks and benefits: risks, benefits and alternatives were discussed Consent given by: patient Patient understanding: patient states understanding of the procedure being performed Patient consent: the patient's understanding of the procedure matches consent given Procedure consent: procedure consent matches procedure scheduled Site marked: the operative site was marked Imaging studies: imaging studies available Patient identity confirmed: verbally with patient Time out: Immediately prior to procedure a "time out" was called to verify the correct patient, procedure, equipment, support staff and site/side marked as required. Local anesthesia used: no  Anesthesia: Local anesthesia used: no  Sedation: Patient sedated: yes Sedation type: moderate (conscious)  sedation Sedatives: fentanyl and propofol Analgesia: fentanyl Sedation start date/time: 05/17/2023 8:32 PM Sedation end date/time: 05/17/2023 8:42 AM Vitals: Vital signs were monitored during sedation.  Patient tolerance: patient tolerated the procedure well with no immediate complications       Medications Ordered in ED Medications  fentaNYL (SUBLIMAZE) injection 100 mcg (100 mcg Intravenous Given 05/17/23 2034)  propofol (DIPRIVAN) 10 mg/mL bolus/IV push 108.9 mg (50 mg Intravenous Given 05/17/23 2035)    ED Course/ Medical Decision Making/ A&P                                 Medical Decision Making Amount and/or Complexity of Data Reviewed Radiology: ordered.   This patient is a 68 y.o. male  who presents to the ED for concern of left ankle pain.   Differential diagnoses prior to evaluation: The emergent differential diagnosis includes, but is not limited to,  fracture, dislocation of ankle, considered closed vs open fracture vs other . This is not an exhaustive differential.   Past Medical History / Co-morbidities / Social History: Schizophrenia, depression, htn, diabetes  Physical Exam: Physical exam performed. The pertinent findings include:  DP, PT pulses 2+, strong in the affected left lower extremity   Swelling, deformity, bruising, and blood-filled blisters noted to left ankle, worst on the medial side.  Step-off of the distal fibula palpated.  No open fracture noted.   Lab Tests/Imaging studies: I personally interpreted labs/imaging and the pertinent results include: Plain film radiograph of the left ankle shows fibular fracture, lateral dislocation, as well as  Suspected acute minimally displaced fracture at the far  posterior/superficial aspect of the posterior malleolus.  .  After reduction with improvement of lateral dislocation and fracture positioning I agree with the radiologist interpretation.   Medications: I ordered medication including fentanyl and  propofol given during conscious sedation.  I have reviewed the patients home medicines and have made adjustments as needed.   Disposition: After consideration of the diagnostic results and the patients response to treatment, I feel that patient with improvement of fracture dislocation after conscious sedation and reduction, continues to remain neurovascularly intact, splint placed, patient will be nonweightbearing on crutches and follow-up with orthopedics for further evaluation and likely surgery, discharged with pain medication.   emergency department workup does not suggest an emergent condition requiring admission or immediate intervention beyond what has been performed at this time. The plan is: as above. The patient is safe for discharge and has been instructed to return immediately for worsening symptoms, change in symptoms or any other concerns.  Final Clinical Impression(s) / ED Diagnoses Final diagnoses:  Closed fracture of distal end of left fibula, unspecified fracture morphology, initial encounter  Ankle dislocation, left, initial encounter    Rx / DC Orders ED Discharge Orders          Ordered    oxyCODONE-acetaminophen (PERCOCET/ROXICET) 5-325 MG tablet  Every 6 hours PRN        05/17/23 2130              [place] 05/17/23 2156    Benjiman Core, MD 05/17/23 2302

## 2023-05-17 NOTE — Discharge Instructions (Addendum)
Please use Tylenol or ibuprofen for pain.  You may use 600 mg ibuprofen every 6 hours or 1000 mg of Tylenol every 6 hours.  You may choose to alternate between the 2.  This would be most effective.  Not to exceed 4 g of Tylenol within 24 hours.  Not to exceed 3200 mg ibuprofen 24 hours.  You can use the stronger narcotic pain medication as needed in place of Tylenol above for severe breakthrough pain.  Please call the orthopedic surgeon on Monday morning to schedule an appointment.

## 2023-05-17 NOTE — Sedation Documentation (Signed)
Another 50 MG IVP given at this time by Ines Bloomer, RN per Dr. Arlington Calix verbal order.

## 2023-05-17 NOTE — Sedation Documentation (Addendum)
Another 50 mg Propofol given IVP by Ines Bloomer, RN per verbal order Dr. Rubin Payor.

## 2023-05-17 NOTE — ED Notes (Signed)
RT placed pt on ETCO2 [place] 4 Lpm for conscious/procedural sedation pre oxygenation. Airway cart available, BVM at bedside, Pt assessed pre procedure w/BLBS of clr/dim, respiratory status stable w/no distress noted.    05/17/23 2024  Oxygen Therapy/Pulse Ox  O2 Device (S)  Nasal Cannula  O2 Therapy (S)  Oxygen  O2 Flow Rate (L/min) (S)  4 L/min  FiO2 (%) (S)  36 %

## 2023-05-17 NOTE — ED Notes (Signed)
Tech, Amy, splinting at this time. Pt tolerating well.

## 2023-05-17 NOTE — ED Notes (Signed)
Pt Duane Hancock titrated from 4 Lpm to 3 Lpm w/sats of 99%, respiratory status stable w/no distress noted. RT will continue to monitor while at [facility].

## 2023-05-17 NOTE — ED Triage Notes (Signed)
Pt POV from home after twisting ankle while trying to get out of his truck last night. Hematoma+blisters noted to L ankle, pedal pulses present. Pt able to rotate ankle slightly.

## 2023-05-17 NOTE — Sedation Documentation (Addendum)
Another 50 mg given IVP given by Ines Bloomer, RN per Dr. Rubin Payor verbal order.

## 2023-05-20 ENCOUNTER — Other Ambulatory Visit: Payer: Self-pay | Admitting: Internal Medicine

## 2023-05-20 ENCOUNTER — Encounter (HOSPITAL_COMMUNITY): Payer: Self-pay

## 2023-05-20 ENCOUNTER — Other Ambulatory Visit: Payer: Self-pay

## 2023-05-20 ENCOUNTER — Inpatient Hospital Stay (HOSPITAL_COMMUNITY): Payer: 59

## 2023-05-20 ENCOUNTER — Inpatient Hospital Stay (HOSPITAL_COMMUNITY)
Admission: AD | Admit: 2023-05-20 | Discharge: 2023-05-23 | DRG: 494 | Disposition: A | Discharge status: Home Health | Payer: 59 | Source: Ambulatory Visit | Attending: Internal Medicine | Admitting: Internal Medicine

## 2023-05-20 ENCOUNTER — Encounter (HOSPITAL_COMMUNITY): Payer: Self-pay | Admitting: Internal Medicine

## 2023-05-20 DIAGNOSIS — E119 Type 2 diabetes mellitus without complications: Secondary | ICD-10-CM | POA: Diagnosis present

## 2023-05-20 DIAGNOSIS — Y9389 Activity, other specified: Secondary | ICD-10-CM | POA: Diagnosis not present

## 2023-05-20 DIAGNOSIS — Z794 Long term (current) use of insulin: Secondary | ICD-10-CM

## 2023-05-20 DIAGNOSIS — Z79899 Other long term (current) drug therapy: Secondary | ICD-10-CM

## 2023-05-20 DIAGNOSIS — R262 Difficulty in walking, not elsewhere classified: Secondary | ICD-10-CM | POA: Diagnosis present

## 2023-05-20 DIAGNOSIS — Y929 Unspecified place or not applicable: Secondary | ICD-10-CM

## 2023-05-20 DIAGNOSIS — Z6832 Body mass index (BMI) 32.0-32.9, adult: Secondary | ICD-10-CM

## 2023-05-20 DIAGNOSIS — E669 Obesity, unspecified: Secondary | ICD-10-CM | POA: Diagnosis present

## 2023-05-20 DIAGNOSIS — Z87891 Personal history of nicotine dependence: Secondary | ICD-10-CM | POA: Diagnosis not present

## 2023-05-20 DIAGNOSIS — F209 Schizophrenia, unspecified: Secondary | ICD-10-CM | POA: Diagnosis present

## 2023-05-20 DIAGNOSIS — S82852A Displaced trimalleolar fracture of left lower leg, initial encounter for closed fracture: Principal | ICD-10-CM | POA: Diagnosis present

## 2023-05-20 DIAGNOSIS — Z791 Long term (current) use of non-steroidal anti-inflammatories (NSAID): Secondary | ICD-10-CM | POA: Diagnosis not present

## 2023-05-20 DIAGNOSIS — Z7984 Long term (current) use of oral hypoglycemic drugs: Secondary | ICD-10-CM | POA: Diagnosis not present

## 2023-05-20 DIAGNOSIS — X501XXA Overexertion from prolonged static or awkward postures, initial encounter: Secondary | ICD-10-CM | POA: Diagnosis not present

## 2023-05-20 DIAGNOSIS — S82851A Displaced trimalleolar fracture of right lower leg, initial encounter for closed fracture: Secondary | ICD-10-CM | POA: Diagnosis not present

## 2023-05-20 DIAGNOSIS — I10 Essential (primary) hypertension: Secondary | ICD-10-CM | POA: Diagnosis present

## 2023-05-20 DIAGNOSIS — F32A Depression, unspecified: Secondary | ICD-10-CM | POA: Diagnosis present

## 2023-05-20 DIAGNOSIS — Z7982 Long term (current) use of aspirin: Secondary | ICD-10-CM

## 2023-05-20 DIAGNOSIS — S82853A Displaced trimalleolar fracture of unspecified lower leg, initial encounter for closed fracture: Principal | ICD-10-CM | POA: Diagnosis present

## 2023-05-20 LAB — HIV ANTIBODY (ROUTINE TESTING W REFLEX): HIV Screen 4th Generation wRfx: NONREACTIVE

## 2023-05-20 LAB — GLUCOSE, CAPILLARY
Glucose-Capillary: 146 mg/dL — ABNORMAL HIGH (ref 70–99)
Glucose-Capillary: 150 mg/dL — ABNORMAL HIGH (ref 70–99)

## 2023-05-20 LAB — HEMOGLOBIN A1C
Hgb A1c MFr Bld: 8.4 % — ABNORMAL HIGH (ref 4.8–5.6)
Mean Plasma Glucose: 194.38 mg/dL

## 2023-05-20 MED ORDER — ONDANSETRON HCL 4 MG PO TABS: 4.0000 mg | ORAL_TABLET | Freq: Four times a day (QID) | ORAL | Status: DC | PRN

## 2023-05-20 MED ORDER — ACETAMINOPHEN 650 MG RE SUPP: 650.0000 mg | Freq: Four times a day (QID) | RECTAL | Status: DC | PRN

## 2023-05-20 MED ORDER — POLYETHYLENE GLYCOL 3350 17 G PO PACK: 17.0000 g | PACK | Freq: Every day | ORAL | Status: DC | PRN

## 2023-05-20 MED ORDER — DOCUSATE SODIUM 100 MG PO CAPS
100.0000 mg | ORAL_CAPSULE | Freq: Two times a day (BID) | ORAL | Status: DC
  Administered 2023-05-20 – 2023-05-23 (×5): 100 mg via ORAL
  Filled 2023-05-20 (×5): qty 1

## 2023-05-20 MED ORDER — HYDROCODONE-ACETAMINOPHEN 5-325 MG PO TABS
1.0000 | ORAL_TABLET | ORAL | Status: DC | PRN
  Administered 2023-05-23: 2 via ORAL
  Filled 2023-05-20: qty 2

## 2023-05-20 MED ORDER — HYDRALAZINE HCL 20 MG/ML IJ SOLN: 5.0000 mg | Freq: Four times a day (QID) | INTRAMUSCULAR | Status: DC | PRN

## 2023-05-20 MED ORDER — PANTOPRAZOLE SODIUM 40 MG PO TBEC
40.0000 mg | DELAYED_RELEASE_TABLET | Freq: Every day | ORAL | Status: DC
  Administered 2023-05-20 – 2023-05-23 (×3): 40 mg via ORAL
  Filled 2023-05-20 (×3): qty 1

## 2023-05-20 MED ORDER — ENOXAPARIN SODIUM 60 MG/0.6ML IJ SOSY
55.0000 mg | PREFILLED_SYRINGE | INTRAMUSCULAR | Status: DC
  Administered 2023-05-20: 55 mg via SUBCUTANEOUS
  Filled 2023-05-20: qty 0.6

## 2023-05-20 MED ORDER — ONDANSETRON HCL 4 MG/2ML IJ SOLN: 4.0000 mg | Freq: Four times a day (QID) | INTRAMUSCULAR | Status: DC | PRN

## 2023-05-20 MED ORDER — MORPHINE SULFATE (PF) 2 MG/ML IV SOLN: 2.0000 mg | INTRAVENOUS | Status: DC | PRN

## 2023-05-20 MED ORDER — SODIUM CHLORIDE 0.9 % IV SOLN: INTRAVENOUS | Status: DC

## 2023-05-20 MED ORDER — TRAZODONE HCL 50 MG PO TABS
75.0000 mg | ORAL_TABLET | Freq: Every day | ORAL | Status: DC
  Administered 2023-05-20 – 2023-05-22 (×3): 75 mg via ORAL
  Filled 2023-05-20 (×3): qty 2

## 2023-05-20 MED ORDER — ZOLPIDEM TARTRATE 5 MG PO TABS: 10.0000 mg | ORAL_TABLET | Freq: Every evening | ORAL | Status: DC | PRN

## 2023-05-20 MED ORDER — ALBUTEROL SULFATE (2.5 MG/3ML) 0.083% IN NEBU: 2.5000 mg | INHALATION_SOLUTION | RESPIRATORY_TRACT | Status: DC | PRN

## 2023-05-20 MED ORDER — EMPAGLIFLOZIN 10 MG PO TABS
10.0000 mg | ORAL_TABLET | Freq: Every day | ORAL | Status: DC
  Administered 2023-05-20 – 2023-05-23 (×3): 10 mg via ORAL
  Filled 2023-05-20 (×4): qty 1

## 2023-05-20 MED ORDER — OXYCODONE-ACETAMINOPHEN 5-325 MG PO TABS
1.0000 | ORAL_TABLET | Freq: Four times a day (QID) | ORAL | Status: DC | PRN
  Administered 2023-05-20 – 2023-05-22 (×2): 1 via ORAL
  Filled 2023-05-20 (×2): qty 1

## 2023-05-20 MED ORDER — AMITRIPTYLINE HCL 50 MG PO TABS
150.0000 mg | ORAL_TABLET | Freq: Every day | ORAL | Status: DC
  Administered 2023-05-20 – 2023-05-22 (×3): 150 mg via ORAL
  Filled 2023-05-20 (×4): qty 3

## 2023-05-20 MED ORDER — ACETAMINOPHEN 325 MG PO TABS: 650.0000 mg | ORAL_TABLET | Freq: Four times a day (QID) | ORAL | Status: DC | PRN

## 2023-05-20 MED ORDER — AMLODIPINE BESYLATE 5 MG PO TABS
5.0000 mg | ORAL_TABLET | Freq: Every day | ORAL | Status: DC
  Administered 2023-05-20 – 2023-05-23 (×3): 5 mg via ORAL
  Filled 2023-05-20 (×3): qty 1

## 2023-05-20 NOTE — Progress Notes (Signed)
Plan of Care Note for accepted transfer   Patient: Duane Hancock MRN: [id number]   DOA: (Not on file)  Facility requesting transfer: Outpatient orthopedics Requesting Provider: Dr. Odis Hollingshead Reason for transfer: Trimalleolar fracture, need for medical optimization Facility course: Patient recently seen in the ED on 8/3 after twisting his ankle x-ray showed fibular fracture and malleolar fracture this was reduced in ED patient was told to be nonweightbearing and was discharged with Ortho follow-up.  Follow-up with orthopedics today.  He has been bearing weight on his dislocated ankle and he lives alone without social support.  Also has multiple medical conditions and has not seen a provider for some time.  Orthopedics is requesting direct admission so patient could be medically optimized perioperatively and hopefully have his procedure done in the next day or 2.  Will likely need TOC consult for arrangements after discharge.  Plan of care: The patient is accepted for admission to Med-surg  unit, at [facility]..   Author: Synetta Fail, MD 05/20/2023  Check [email or web address] for on-call coverage.  Nursing staff, Please call [facility] Admits & Consults System-Wide number on Amion as soon as patient's arrival, so appropriate admitting provider can evaluate the pt.

## 2023-05-20 NOTE — H&P (Addendum)
[facility] History and Physical  Duane Hancock WGN:[id number] DOB: 23-Dec-1955 DOA: 05/20/2023  Referring physician: ED  PCP: Fleet Contras, MD   Patient is coming from: Home   Chief Complaint: Ankle pain.  HPI:   Patient is a 67 years old male with past medical history of depression, hypertension, schizophrenia and tobacco dependence presented to hospital initially on 05/17/2023 with twisting of his ankle while trying to get out of his truck with significant pain.  On presentation he had hematoma and blisters.  He was unable to bear weight.  Patient had fracture of the left ankle but without any open fracture.  Patient was given sedation in the ED and fracture was reduced, splint was placed and was advised nonweightbearing on crutches with outpatient follow-up with orthopedics.  Patient then was discharged home.  Patient did have follow-up with orthopedics today and he had been weightbearing on his dislocated ankle.  He is without any social support.  Orthopedic requested direct admission to hospital for medical optimization perioperatively and hopefully surgical intervention in the next day or 2.  Patient denies any chest pain, shortness of breath, dyspnea.  Stated that he used to smoke in the past but has not smoked in 10 years.  Drinks occasional alcohol.  Denies any fever, chills or rigor.  He states that he has been having a lot of pain in his leg and has not been able to ambulate much.  Denies any nausea, vomiting, abdominal pain.  No urinary urgency, frequency or dysuria.  He lives by himself and has some friends who are visiting.  Assessment and Plan  Close left ankle trimalleolar fracture.  Seen by orthopedic in the clinic today.  Dr. Odis Hollingshead. likely will need surgical intervention.  Will continue conservative treatment for now.  Continue splint, pain management.  History of hypertension.  Continue amlodipine.  Add as needed hydralazine.  History of schizophrenia.  On  trazodone.  Will continue.  History of diabetes mellitus as per the patient.  Takes Jardiance.  Also states he has metformin.  Did not see in the med rec.  Will continue with Jardiance.  Add sliding scale insulin.  History of tobacco dependence.  Currently smoking.  Social issues.  Patient does not have much support social support and lives alone.  Unable to weight-bear.  Might need rehabilitation/TOC help on discharge.  Will consult UC.   DVT Prophylaxis: Lovenox subcu  Review of Systems:  All systems were reviewed and were negative unless otherwise mentioned in the HPI   Past Medical History:  Diagnosis Date   Depression    Hypertension    Schizophrenia (HCC)    Seasonal allergies    No past surgical history on file.  Social History:  reports that he quit smoking about 12 years ago. He has never used smokeless tobacco. He reports that he does not drink alcohol and does not use drugs.  No Known Allergies  Family History  Problem Relation Age of Onset   Colon cancer Father    Esophageal cancer Neg Hx    Stomach cancer Neg Hx    Rectal cancer Neg Hx      Prior to Admission medications   Medication Sig Start Date End Date Taking? Authorizing Provider  albuterol (VENTOLIN HFA) 108 (90 Base) MCG/ACT inhaler Inhale 2 puffs into the lungs every 4 (four) hours as needed for shortness of breath. 05/07/23  Yes [provider]  amitriptyline (ELAVIL) 25 MG tablet Take 150 mg by mouth at bedtime.  Yes [provider]  amLODipine (NORVASC) 5 MG tablet Take 5 mg by mouth daily. 03/27/23  Yes [provider]  diclofenac (VOLTAREN) 75 MG EC tablet Take 75 mg by mouth 2 (two) times daily as needed for mild pain.   Yes [provider]  JARDIANCE 10 MG TABS tablet Take 10 mg by mouth daily. 05/12/23  Yes [provider]  meloxicam (MOBIC) 15 MG tablet Take 15 mg by mouth daily. 04/05/23  Yes [provider]  NEXIUM 40 MG capsule Take 1  capsule by mouth daily. 01/05/14  Yes [provider]  oxyCODONE-acetaminophen (PERCOCET/ROXICET) 5-325 MG tablet Take 1 tablet by mouth every 6 (six) hours as needed for severe pain. 05/17/23  Yes Prosperi, Christian H, PA-C  traZODone (DESYREL) 100 MG tablet Take 75 mg by mouth at bedtime.    Yes [provider]  zolpidem (AMBIEN) 10 MG tablet Take 10 mg by mouth at bedtime as needed for sleep.   Yes [provider]  acetaminophen (TYLENOL) 500 MG tablet Take 1,000 mg by mouth daily as needed for moderate pain. Patient not taking: Reported on 05/20/2023    [provider]    Physical Exam: Vitals:   05/20/23 1702  BP: (!) 146/93  Resp: 17  Temp: 98 F (36.7 C)  TempSrc: Oral  SpO2: 98%   Wt Readings from Last 3 Encounters:  05/17/23 108.9 kg  11/08/17 117.9 kg  12/20/15 117.2 kg   There is no height or weight on file to calculate BMI.  General:  Average built, not in obvious distress HENT: Normocephalic, No scleral pallor or icterus noted. Oral mucosa is moist.  Chest:  Clear breath sounds.  . No crackles or wheezes.  CVS: S1 &S2 heard. No murmur.  Regular rate and rhythm. Abdomen: Soft, nontender, nondistended.  Bowel sounds are heard. No abdominal mass palpated Extremities: No cyanosis, clubbing or edema.  Left leg with a splint.  Distal capillary refill present. Psych: Alert, awake and oriented, flat affect. CNS:  No cranial nerve deficits.  Moves all extremities. Skin: Warm and dry.  No rashes noted.  Labs on Admission:   CBC: No results for input(s): "WBC", "NEUTROABS", "HGB", "HCT", "MCV", "PLT" in the last 168 hours.  Basic Metabolic Panel: No results for input(s): "NA", "K", "CL", "CO2", "GLUCOSE", "BUN", "CREATININE", "CALCIUM", "MG", "PHOS" in the last 168 hours.  Liver Function Tests: No results for input(s): "AST", "ALT", "ALKPHOS", "BILITOT", "PROT", "ALBUMIN" in the last 168 hours. No results for input(s): "LIPASE", "AMYLASE"  in the last 168 hours. No results for input(s): "AMMONIA" in the last 168 hours.  Cardiac Enzymes: No results for input(s): "CKTOTAL", "CKMB", "CKMBINDEX", "TROPONINI" in the last 168 hours.  BNP (last 3 results) No results for input(s): "BNP" in the last 8760 hours.  ProBNP (last 3 results) No results for input(s): "PROBNP" in the last 8760 hours.  CBG: Recent Labs  Lab 05/20/23 1707  GLUCAP 146*    Lipase     Component Value Date/Time   LIPASE 20 12/20/2015 0808     Urinalysis    Component Value Date/Time   COLORURINE YELLOW 12/20/2015 0038   APPEARANCEUR CLEAR 12/20/2015 0038   LABSPEC 1.019 12/20/2015 0038   PHURINE 5.0 12/20/2015 0038   GLUCOSEU NEGATIVE 12/20/2015 0038   HGBUR SMALL (A) 12/20/2015 0038   BILIRUBINUR NEGATIVE 12/20/2015 0038   KETONESUR NEGATIVE 12/20/2015 0038   PROTEINUR 30 (A) 12/20/2015 0038   NITRITE NEGATIVE 12/20/2015 0038   LEUKOCYTESUR  NEGATIVE 12/20/2015 0038     Drugs of Abuse  No results found for: "LABOPIA", "COCAINSCRNUR", "LABBENZ", "AMPHETMU", "THCU", "LABBARB"    Radiological Exams on Admission: No results found.  EKG: Not available for review   Consultant: Will consult orthopedics  Code Status: Full code  Microbiology none  Antibiotics: None  Family Communication:  Patients' condition and plan of care including tests being ordered have been discussed with the patient  who indicate understanding and agree with the plan.   Status is: Inpatient Remains inpatient appropriate because: Trimalleolar fracture, possible need for surgical intervention, possible rehabilitation.   Severity of Illness: The appropriate patient status for this patient is INPATIENT. Inpatient status is judged to be reasonable and necessary in order to provide the required intensity of service to ensure the patient's safety. The patient's presenting symptoms, physical exam findings, and initial radiographic and laboratory data in the context of  their chronic comorbidities is felt to place them at high risk for further clinical deterioration. Furthermore, it is not anticipated that the patient will be medically stable for discharge from the hospital within 2 midnights of admission.  I certify that at the point of admission it is my clinical judgment that the patient will require inpatient hospital care spanning beyond 2 midnights from the point of admission due to high intensity of service, high risk for further deterioration and high frequency of surveillance required.  Signed, Joycelyn Das, MD [facility] 05/20/2023

## 2023-05-21 ENCOUNTER — Encounter (HOSPITAL_COMMUNITY)
Admission: AD | Disposition: A | Discharge status: Home Health | Payer: Self-pay | Source: Ambulatory Visit | Attending: Internal Medicine

## 2023-05-21 ENCOUNTER — Inpatient Hospital Stay (HOSPITAL_COMMUNITY): Payer: 59 | Admitting: Certified Registered Nurse Anesthetist

## 2023-05-21 ENCOUNTER — Other Ambulatory Visit: Payer: Self-pay

## 2023-05-21 ENCOUNTER — Inpatient Hospital Stay (HOSPITAL_COMMUNITY): Payer: 59

## 2023-05-21 ENCOUNTER — Encounter (HOSPITAL_COMMUNITY): Payer: Self-pay | Admitting: Internal Medicine

## 2023-05-21 DIAGNOSIS — I10 Essential (primary) hypertension: Secondary | ICD-10-CM

## 2023-05-21 DIAGNOSIS — Z87891 Personal history of nicotine dependence: Secondary | ICD-10-CM | POA: Diagnosis not present

## 2023-05-21 DIAGNOSIS — R262 Difficulty in walking, not elsewhere classified: Secondary | ICD-10-CM | POA: Diagnosis not present

## 2023-05-21 DIAGNOSIS — S82852A Displaced trimalleolar fracture of left lower leg, initial encounter for closed fracture: Secondary | ICD-10-CM

## 2023-05-21 HISTORY — PX: ORIF ANKLE FRACTURE: SHX5408

## 2023-05-21 LAB — SURGICAL PCR SCREEN
MRSA, PCR: NEGATIVE
Staphylococcus aureus: POSITIVE — AB

## 2023-05-21 LAB — GLUCOSE, CAPILLARY
Glucose-Capillary: 103 mg/dL — ABNORMAL HIGH (ref 70–99)
Glucose-Capillary: 96 mg/dL (ref 70–99)

## 2023-05-21 SURGERY — OPEN REDUCTION INTERNAL FIXATION (ORIF) ANKLE FRACTURE
Anesthesia: General | Site: Ankle | Laterality: Left

## 2023-05-21 MED ORDER — ACETAMINOPHEN 500 MG PO TABS: 1000.0000 mg | ORAL_TABLET | Freq: Once | ORAL | Status: DC | PRN

## 2023-05-21 MED ORDER — FENTANYL CITRATE (PF) 250 MCG/5ML IJ SOLN
INTRAMUSCULAR | Status: DC | PRN
  Administered 2023-05-21: 100 ug via INTRAVENOUS

## 2023-05-21 MED ORDER — DEXAMETHASONE SODIUM PHOSPHATE 10 MG/ML IJ SOLN
INTRAMUSCULAR | Status: DC | PRN
Start: 2023-05-21 — End: 2023-05-21
  Administered 2023-05-21: 10 mg

## 2023-05-21 MED ORDER — PROPOFOL 10 MG/ML IV BOLUS
INTRAVENOUS | Status: DC | PRN
Start: 2023-05-21 — End: 2023-05-21
  Administered 2023-05-21: 200 mg via INTRAVENOUS

## 2023-05-21 MED ORDER — LIDOCAINE 2% (20 MG/ML) 5 ML SYRINGE
INTRAMUSCULAR | Status: DC | PRN
  Administered 2023-05-21: 40 mg via INTRAVENOUS

## 2023-05-21 MED ORDER — ROPIVACAINE HCL 5 MG/ML IJ SOLN
INTRAMUSCULAR | Status: DC | PRN
  Administered 2023-05-21: 10 mL via PERINEURAL
  Administered 2023-05-21: 30 mL via PERINEURAL

## 2023-05-21 MED ORDER — FENTANYL CITRATE (PF) 100 MCG/2ML IJ SOLN
INTRAMUSCULAR | Status: DC | PRN
  Administered 2023-05-21: 100 ug via INTRAVENOUS

## 2023-05-21 MED ORDER — PROPOFOL 10 MG/ML IV BOLUS
INTRAVENOUS | Status: AC
  Filled 2023-05-21: qty 20

## 2023-05-21 MED ORDER — VANCOMYCIN HCL 1000 MG IV SOLR
INTRAVENOUS | Status: AC
  Filled 2023-05-21: qty 20

## 2023-05-21 MED ORDER — ACETAMINOPHEN 160 MG/5ML PO SOLN: 1000.0000 mg | Freq: Once | ORAL | Status: DC | PRN

## 2023-05-21 MED ORDER — MIDAZOLAM HCL 2 MG/2ML IJ SOLN
INTRAMUSCULAR | Status: DC | PRN
  Administered 2023-05-21: 2 mg via INTRAVENOUS

## 2023-05-21 MED ORDER — PHENYLEPHRINE 80 MCG/ML (10ML) SYRINGE FOR IV PUSH (FOR BLOOD PRESSURE SUPPORT)
PREFILLED_SYRINGE | INTRAVENOUS | Status: DC | PRN
  Administered 2023-05-21 (×2): 160 ug via INTRAVENOUS

## 2023-05-21 MED ORDER — CEFAZOLIN SODIUM-DEXTROSE 2-4 GM/100ML-% IV SOLN
INTRAVENOUS | Status: AC
  Administered 2023-05-21: 2000 mg
  Filled 2023-05-21: qty 100

## 2023-05-21 MED ORDER — MIDAZOLAM HCL 2 MG/2ML IJ SOLN
INTRAMUSCULAR | Status: AC
  Filled 2023-05-21: qty 2

## 2023-05-21 MED ORDER — ONDANSETRON HCL 4 MG/2ML IJ SOLN
INTRAMUSCULAR | Status: AC
  Filled 2023-05-21: qty 2

## 2023-05-21 MED ORDER — FENTANYL CITRATE (PF) 100 MCG/2ML IJ SOLN: 25.0000 ug | INTRAMUSCULAR | Status: DC | PRN

## 2023-05-21 MED ORDER — CEFAZOLIN SODIUM-DEXTROSE 1-4 GM/50ML-% IV SOLN
1.0000 g | Freq: Three times a day (TID) | INTRAVENOUS | Status: AC
  Administered 2023-05-21 – 2023-05-22 (×2): 1 g via INTRAVENOUS
  Filled 2023-05-21 (×2): qty 50

## 2023-05-21 MED ORDER — CHLORHEXIDINE GLUCONATE 0.12 % MT SOLN
15.0000 mL | Freq: Once | OROMUCOSAL | Status: AC
  Administered 2023-05-21: 15 mL via OROMUCOSAL
  Filled 2023-05-21: qty 15

## 2023-05-21 MED ORDER — ACETAMINOPHEN 10 MG/ML IV SOLN: 1000.0000 mg | Freq: Once | INTRAVENOUS | Status: DC | PRN

## 2023-05-21 MED ORDER — EPHEDRINE SULFATE-NACL 50-0.9 MG/10ML-% IV SOSY
PREFILLED_SYRINGE | INTRAVENOUS | Status: DC | PRN
  Administered 2023-05-21 (×4): 5 mg via INTRAVENOUS

## 2023-05-21 MED ORDER — LACTATED RINGERS IV SOLN: INTRAVENOUS | Status: DC

## 2023-05-21 MED ORDER — ONDANSETRON HCL 4 MG/2ML IJ SOLN
INTRAMUSCULAR | Status: DC | PRN
Start: 2023-05-21 — End: 2023-05-21
  Administered 2023-05-21: 4 mg via INTRAVENOUS

## 2023-05-21 MED ORDER — FENTANYL CITRATE (PF) 250 MCG/5ML IJ SOLN
INTRAMUSCULAR | Status: AC
  Filled 2023-05-21: qty 5

## 2023-05-21 MED ORDER — EPHEDRINE 5 MG/ML INJ
INTRAVENOUS | Status: AC
  Filled 2023-05-21: qty 5

## 2023-05-21 MED ORDER — SODIUM CHLORIDE 0.9 % IR SOLN
Status: DC | PRN
  Administered 2023-05-21: 1000 mL

## 2023-05-21 MED ORDER — ORAL CARE MOUTH RINSE: 15.0000 mL | Freq: Once | OROMUCOSAL | Status: AC

## 2023-05-21 MED ORDER — FENTANYL CITRATE (PF) 100 MCG/2ML IJ SOLN
INTRAMUSCULAR | Status: AC
  Filled 2023-05-21: qty 2

## 2023-05-21 MED ORDER — OXYCODONE HCL 5 MG/5ML PO SOLN: 5.0000 mg | Freq: Once | ORAL | Status: DC | PRN

## 2023-05-21 MED ORDER — OXYCODONE HCL 5 MG PO TABS: 5.0000 mg | ORAL_TABLET | Freq: Once | ORAL | Status: DC | PRN

## 2023-05-21 MED ORDER — PHENYLEPHRINE 80 MCG/ML (10ML) SYRINGE FOR IV PUSH (FOR BLOOD PRESSURE SUPPORT)
PREFILLED_SYRINGE | INTRAVENOUS | Status: AC
  Filled 2023-05-21: qty 10

## 2023-05-21 MED ORDER — VANCOMYCIN HCL 500 MG IV SOLR
INTRAVENOUS | Status: DC | PRN
  Administered 2023-05-21: 1000 mg via TOPICAL

## 2023-05-21 SURGICAL SUPPLY — 82 items
ANCHOR SUT KEITH ABD SZ2 STR (SUTURE) ×1 IMPLANT
APL PRP STRL LF DISP 70% ISPRP (MISCELLANEOUS) ×2
BAG COUNTER SPONGE SURGICOUNT (BAG) IMPLANT
BAG SPNG CNTER NS LX DISP (BAG)
BANDAGE ESMARK 6X9 LF (GAUZE/BANDAGES/DRESSINGS) ×1 IMPLANT
BIT DRILL 2.5X2.75 QC CALB (BIT) IMPLANT
BLADE LONG MED 31X9 (MISCELLANEOUS) ×1 IMPLANT
BLADE SURG 15 STRL LF DISP TIS (BLADE) ×2 IMPLANT
BLADE SURG 15 STRL SS (BLADE) ×2
BNDG CMPR 5X4 CHSV STRCH STRL (GAUZE/BANDAGES/DRESSINGS) ×1
BNDG CMPR 5X6 CHSV STRCH STRL (GAUZE/BANDAGES/DRESSINGS) ×1
BNDG CMPR 9X6 STRL LF SNTH (GAUZE/BANDAGES/DRESSINGS) ×1
BNDG CMPR MED 15X6 ELC VLCR LF (GAUZE/BANDAGES/DRESSINGS) ×1
BNDG COHESIVE 4X5 TAN STRL LF (GAUZE/BANDAGES/DRESSINGS) ×1 IMPLANT
BNDG COHESIVE 6X5 TAN NS LF (GAUZE/BANDAGES/DRESSINGS) ×1 IMPLANT
BNDG COHESIVE 6X5 TAN ST LF (GAUZE/BANDAGES/DRESSINGS) ×1 IMPLANT
BNDG ELASTIC 4X5.8 VLCR STR LF (GAUZE/BANDAGES/DRESSINGS) ×1 IMPLANT
BNDG ELASTIC 6X15 VLCR STRL LF (GAUZE/BANDAGES/DRESSINGS) IMPLANT
BNDG ELASTIC 6X5.8 VLCR STR LF (GAUZE/BANDAGES/DRESSINGS) ×1 IMPLANT
BNDG ESMARK 6X9 LF (GAUZE/BANDAGES/DRESSINGS) ×1
BNDG GAUZE DERMACEA FLUFF 4 (GAUZE/BANDAGES/DRESSINGS) ×1 IMPLANT
BNDG GZE DERMACEA 4 6PLY (GAUZE/BANDAGES/DRESSINGS) ×1
CHLORAPREP W/TINT 26 (MISCELLANEOUS) ×2 IMPLANT
COVER SURGICAL LIGHT HANDLE (MISCELLANEOUS) ×1 IMPLANT
CUFF TOURN SGL QUICK 34 (TOURNIQUET CUFF) ×1
CUFF TRNQT CYL 34X4.125X (TOURNIQUET CUFF) ×1 IMPLANT
DRAPE C-ARM 42X120 X-RAY (DRAPES) IMPLANT
DRAPE C-ARM MINI 42X72 WSTRAPS (DRAPES) ×1 IMPLANT
DRAPE C-ARMOR (DRAPES) ×1 IMPLANT
DRAPE EXTREMITY T 121X128X90 (DISPOSABLE) ×1 IMPLANT
DRAPE OEC MINIVIEW 54X84 (DRAPES) ×1 IMPLANT
DRAPE ORTHO SPLIT 77X108 STRL (DRAPES) ×2
DRAPE SURG ORHT 6 SPLT 77X108 (DRAPES) ×2 IMPLANT
DRAPE U-SHAPE 47X51 STRL (DRAPES) ×1 IMPLANT
DRSG ADAPTIC 3X8 NADH LF (GAUZE/BANDAGES/DRESSINGS) ×1 IMPLANT
DRSG MEPITEL 3X4 ME34 (GAUZE/BANDAGES/DRESSINGS) IMPLANT
ELECT REM PT RETURN 15FT ADLT (MISCELLANEOUS) ×1 IMPLANT
FACESHIELD WRAPAROUND (MASK) ×1 IMPLANT
FACESHIELD WRAPAROUND OR TEAM (MASK) ×1 IMPLANT
FIXATION ZIPTIGHT ANKLE SNDSMS (Ankle) IMPLANT
GAUZE PAD ABD 8X10 STRL (GAUZE/BANDAGES/DRESSINGS) ×5 IMPLANT
GAUZE SPONGE 4X4 12PLY STRL (GAUZE/BANDAGES/DRESSINGS) ×2 IMPLANT
GAUZE SPONGE 4X4 16PLY XRAY LF (GAUZE/BANDAGES/DRESSINGS) IMPLANT
GAUZE XEROFORM 5X9 LF (GAUZE/BANDAGES/DRESSINGS) ×1 IMPLANT
GLOVE SURG ENC MOIS LTX SZ7.5 (GLOVE) ×2 IMPLANT
GLOVE SURG MICRO LTX SZ7.5 (GLOVE) ×2 IMPLANT
GLOVE SURG POLYISO LF SZ7.5 (GLOVE) ×1 IMPLANT
GLOVE SURG UNDER POLY LF SZ7.5 (GLOVE) ×2 IMPLANT
GOWN STRL REUS W/ TWL LRG LVL3 (GOWN DISPOSABLE) ×1 IMPLANT
GOWN STRL REUS W/TWL LRG LVL3 (GOWN DISPOSABLE) ×1
K-WIRE ACE 1.6X6 (WIRE) ×3
KIT BASIN OR (CUSTOM PROCEDURE TRAY) ×1 IMPLANT
KIT TURNOVER KIT A (KITS) IMPLANT
KWIRE ACE 1.6X6 (WIRE) IMPLANT
NDL HYPO 22X1.5 SAFETY MO (MISCELLANEOUS) ×1 IMPLANT
NDL MAYO CATGUT SZ4 TPR NDL (NEEDLE) IMPLANT
NEEDLE HYPO 22X1.5 SAFETY MO (MISCELLANEOUS) ×1 IMPLANT
NEEDLE MAYO CATGUT SZ4 (NEEDLE) IMPLANT
NS IRRIG 1000ML POUR BTL (IV SOLUTION) ×1 IMPLANT
PACK ORTHO EXTREMITY (CUSTOM PROCEDURE TRAY) ×1 IMPLANT
PAD CAST 4YDX4 CTTN HI CHSV (CAST SUPPLIES) ×6 IMPLANT
PADDING CAST COTTON 4X4 STRL (CAST SUPPLIES) ×1
PADDING CAST COTTON 6X4 STRL (CAST SUPPLIES) ×1 IMPLANT
PLATE LOCK 7H 92 BILAT FIB (Plate) IMPLANT
PROTECTOR NERVE ULNAR (MISCELLANEOUS) ×1 IMPLANT
SCREW LOCK 3.5X14 DIST TIB (Screw) IMPLANT
SCREW LOCK CORT STAR 3.5X12 (Screw) IMPLANT
SCREW NON LOCKING LP 3.5 14MM (Screw) IMPLANT
SPIKE FLUID TRANSFER (MISCELLANEOUS) IMPLANT
SPLINT PLASTER CAST FAST 5X30 (CAST SUPPLIES) IMPLANT
SPONGE T-LAP 18X18 ~~LOC~~+RFID (SPONGE) ×1 IMPLANT
STAPLER VISISTAT 35W (STAPLE) ×1 IMPLANT
STOCKINETTE TUBULAR 6 INCH (GAUZE/BANDAGES/DRESSINGS) ×1 IMPLANT
STOCKINETTE TUBULAR COTT 4X25 (GAUZE/BANDAGES/DRESSINGS) ×1 IMPLANT
STRIP CLOSURE SKIN 1/2X4 (GAUZE/BANDAGES/DRESSINGS) ×1 IMPLANT
SUCTION TUBE FRAZIER 10FR DISP (SUCTIONS) ×1 IMPLANT
SUT ETHILON 3 0 PS 1 (SUTURE) ×1 IMPLANT
SUT VIC AB 2-0 SH 27 (SUTURE) ×1
SUT VIC AB 2-0 SH 27XBRD (SUTURE) ×1 IMPLANT
SYR CONTROL 10ML LL (SYRINGE) ×1 IMPLANT
WATER STERILE IRR 1000ML POUR (IV SOLUTION) ×1 IMPLANT
ZIPTIGHT ANKLE SYNODESMOSS FIX (Ankle) ×1 IMPLANT

## 2023-05-21 NOTE — H&P (Signed)
ORTHOPAEDIC SURGERY H&P  Subjective:  The patient presents with left ankle fx.   Past Medical History:  Diagnosis Date   Depression    Hypertension    Schizophrenia (HCC)    Seasonal allergies     History reviewed. No pertinent surgical history.   (Not in an outpatient encounter)    No Known Allergies  Social History   Socioeconomic History   Marital status: Married    Spouse name: Not on file   Number of children: Not on file   Years of education: Not on file   Highest education level: Not on file  Occupational History   Not on file  Tobacco Use   Smoking status: Former    Current packs/day: 0.00    Types: Cigarettes    Quit date: 10/14/2010    Years since quitting: 12.6   Smokeless tobacco: Never  Substance and Sexual Activity   Alcohol use: No   Drug use: No   Sexual activity: Not on file  Other Topics Concern   Not on file  Social History Narrative   Not on file   Social Determinants of Health   Financial Resource Strain: Not on file  Food Insecurity: No Food Insecurity (05/20/2023)   Hunger Vital Sign    Worried About Running Out of Food in the Last Year: Never true    Ran Out of Food in the Last Year: Never true  Transportation Needs: No Transportation Needs (05/20/2023)   PRAPARE - Administrator, Civil Service (Medical): No    Lack of Transportation (Non-Medical): No  Physical Activity: Not on file  Stress: Not on file  Social Connections: Not on file  Intimate Partner Violence: Not At Risk (05/20/2023)   Humiliation, Afraid, Rape, and Kick questionnaire    Fear of Current or Ex-Partner: No    Emotionally Abused: No    Physically Abused: No    Sexually Abused: No     History reviewed. No pertinent family history.   Review of Systems Pertinent items are noted in HPI.  Objective: Vital signs in last 24 hours:    05/21/2023    3:18 PM 05/21/2023    7:47 AM 05/21/2023    4:48 AM  Vitals with BMI  Height 6\' 0"     Weight 240 lbs     BMI 32.54    Systolic 161 144 578  Diastolic 82 52 95  Pulse 73 71 58      EXAM: General: Well nourished, well developed. Awake, alert and oriented to time, place, person. Normal mood and affect. No apparent distress. Breathing room air.  Operative Lower Extremity: Alignment - Neutral Deformity - None Skin intact Tenderness to palpation - left ankle 5/5 TA, PT, GS, Per, EHL, FHL Sensation intact to light touch throughout Palpable DP and PT pulses Special testing: None  The contralateral foot/ankle was examined for comparison and noted to be neurovascularly intact with no localized deformity, swelling, or tenderness.  Imaging Review All images taken were independently reviewed by me.  Assessment/Plan: The clinical and radiographic findings were reviewed and discussed at length with the patient.  The patient has left ankle fx.  We spoke at length about the natural course of these findings. We discussed nonoperative and operative treatment options in detail.  The risks and benefits were presented and reviewed. The risks due to implant failure/irritation, infection, stiffness, nerve/vessel/tendon injury, wound healing issues, failure of this surgery, need for further surgery, thromboembolic events, amputation, death among others were  discussed. The patient acknowledged the explanation and agreed to proceed with the plan.  Duane Hancock  Orthopaedic Surgery [facility]

## 2023-05-21 NOTE — Plan of Care (Signed)

## 2023-05-21 NOTE — Anesthesia Procedure Notes (Signed)
Procedure Name: LMA Insertion Date/Time: 05/21/2023 6:46 PM  Performed by: Armanda Heritage, CRNAPre-anesthesia Checklist: Patient identified, Emergency Drugs available, Suction available, Patient being monitored and Timeout performed Patient Re-evaluated:Patient Re-evaluated prior to induction Oxygen Delivery Method: Circle system utilized Preoxygenation: Pre-oxygenation with 100% oxygen Induction Type: IV induction LMA: LMA inserted LMA Size: 5.0 Number of attempts: 1 Dental Injury: Teeth and Oropharynx as per pre-operative assessment

## 2023-05-21 NOTE — Anesthesia Postprocedure Evaluation (Signed)
Anesthesia Post Note  Patient: Duane Hancock  Procedure(s) Performed: OPEN REDUCTION INTERNAL FIXATION (ORIF) ANKLE FRACTURE TRIMAL WITH SYNDESMOSIS (Left: Ankle)     Patient location during evaluation: PACU Anesthesia Type: General and Regional Level of consciousness: awake and alert, oriented and patient cooperative Pain management: pain level controlled Vital Signs Assessment: post-procedure vital signs reviewed and stable Respiratory status: spontaneous breathing, nonlabored ventilation and respiratory function stable Cardiovascular status: blood pressure returned to baseline and stable Postop Assessment: no apparent nausea or vomiting Anesthetic complications: no   No notable events documented.  Last Vitals:  Vitals:   05/21/23 1954 05/21/23 2000  BP: (!) 143/78 (!) 142/73  Pulse: 78 69  Resp:  12  Temp: 36.4 C   SpO2: 97% 94%    Last Pain:  Vitals:   05/21/23 1522  TempSrc:   PainSc: 8                  ,E. 

## 2023-05-21 NOTE — Anesthesia Preprocedure Evaluation (Addendum)
Anesthesia Evaluation  Patient identified by MRN, date of birth, ID band Patient awake    Reviewed: Allergy & Precautions, NPO status , Patient's Chart, lab work & pertinent test results  Airway Mallampati: III  TM Distance: >3 FB Neck ROM: Full    Dental  (+) Dental Advisory Given, Poor Dentition, Missing   Pulmonary former smoker   Pulmonary exam normal breath sounds clear to auscultation       Cardiovascular hypertension, Pt. on medications negative cardio ROS Normal cardiovascular exam Rhythm:Regular Rate:Normal     Neuro/Psych  PSYCHIATRIC DISORDERS  Depression  Schizophrenia  negative neurological ROS     GI/Hepatic negative GI ROS, Neg liver ROS,,,  Endo/Other  negative endocrine ROS    Renal/GU negative Renal ROS     Musculoskeletal negative musculoskeletal ROS (+)    Abdominal   Peds  Hematology negative hematology ROS (+)   Anesthesia Other Findings Day of surgery medications reviewed with the patient.  Reproductive/Obstetrics                              Anesthesia Physical Anesthesia Plan  ASA: 3  Anesthesia Plan: General   Post-op Pain Management: Ofirmev IV (intra-op)*, Toradol IV (intra-op)* and Regional block*   Induction: Intravenous  PONV Risk Score and Plan: 2 and Midazolam, Dexamethasone and Ondansetron  Airway Management Planned: LMA  Additional Equipment:   Intra-op Plan:   Post-operative Plan: Extubation in OR  Informed Consent: I have reviewed the patients History and Physical, chart, labs and discussed the procedure including the risks, benefits and alternatives for the proposed anesthesia with the patient or authorized representative who has indicated his/her understanding and acceptance.     Dental advisory given  Plan Discussed with: CRNA  Anesthesia Plan Comments:          Anesthesia Quick Evaluation

## 2023-05-21 NOTE — Anesthesia Procedure Notes (Signed)
Anesthesia Regional Block: Adductor canal block   Pre-Anesthetic Checklist: , timeout performed,  Correct Patient, Correct Site, Correct Laterality,  Correct Procedure, Correct Position, site marked,  Risks and benefits discussed,  Surgical consent,  Pre-op evaluation,  At surgeon's request and post-op pain management  Laterality: Left  Prep: chloraprep       Needles:  Injection technique: Single-shot  Needle Type: Echogenic Needle     Needle Length: 9cm  Needle Gauge: 21     Additional Needles:   Procedures:,,,, ultrasound used (permanent image in chart),,    Narrative:  Start time: 05/21/2023 5:50 PM End time: 05/21/2023 5:54 PM Injection made incrementally with aspirations every 5 mL.  Performed by: Personally  Anesthesiologist: Collene Schlichter, MD  Additional Notes: No pain on injection. No increased resistance to injection. Injection made in 5cc increments.  Good needle visualization.  Patient tolerated procedure well.

## 2023-05-21 NOTE — H&P (Signed)

## 2023-05-21 NOTE — Transfer of Care (Signed)
Immediate Anesthesia Transfer of Care Note  Patient: Duane Hancock  Procedure(s) Performed: OPEN REDUCTION INTERNAL FIXATION (ORIF) ANKLE FRACTURE TRIMAL WITH SYNDESMOSIS (Left: Ankle)  Patient Location: PACU  Anesthesia Type:General  Level of Consciousness: awake, alert , and oriented  Airway & Oxygen Therapy: Patient Spontanous Breathing  Post-op Assessment: Report given to RN and Post -op Vital signs reviewed and stable  Post vital signs: Reviewed and stable  Last Vitals:  Vitals Value Taken Time  BP 143/78 05/21/23 1954  Temp    Pulse 77 05/21/23 1955  Resp 14 05/21/23 1955  SpO2 96 % 05/21/23 1955  Vitals shown include unfiled device data.  Last Pain:  Vitals:   05/21/23 1522  TempSrc:   PainSc: 8          Complications: No notable events documented.

## 2023-05-21 NOTE — TOC CM/SW Note (Signed)
Transition of Care [facility]) - Inpatient Brief Assessment   Patient Details  Name: OSINACHI ZUCCARELLO MRN: [id number] Date of Birth: 1955/11/03  Transition of Care [facility]) CM/SW Contact:    Epifanio Lesches, RN Phone Number: 05/21/2023, 2:42 PM   Clinical Narrative: Pt with close left ankle trimalleolar fracture. From home alone. No DME  usage.  Plan: orthopedics following, medical optimization perioperatively and surgical intervention   TOC team following and will assist with needs.  Transition of Care Asessment: Insurance and Status: Insurance coverage has been reviewed Patient has primary care physician: Yes Home environment has been reviewed: From home alone Prior level of function:: PTA independent with ADL's Prior/Current Home Services: No current home services Social Determinants of Health Reivew: SDOH reviewed no interventions necessary Readmission risk has been reviewed: No Transition of care needs: transition of care needs identified, TOC will continue to follow

## 2023-05-21 NOTE — Op Note (Signed)
05/20/2023 - 05/21/2023  7:47 PM   PATIENT: Duane Hancock  67 y.o. male  MRN: [id number]   PRE-OPERATIVE DIAGNOSIS:   Displaced left trimalleolar ankle fracture   POST-OPERATIVE DIAGNOSIS:   Same   PROCEDURE: 1] Open reduction internal fixation of left trimalleolar ankle fracture without fixation of posterior malleolus 2] Open reduction internal fixation of left ankle syndesmosis   SURGEON:  Netta Cedars, MD   ASSISTANT: None   ANESTHESIA: General, regional   EBL: Minimal   TOURNIQUET:   * Missing tourniquet times found for documented tourniquets in log: [id number] *   COMPLICATIONS: None apparent   DISPOSITION: Extubated, awake and stable to recovery.   INDICATION FOR PROCEDURE: The patient presented with above diagnosis.  We discussed the diagnosis, alternative treatment options, risks and benefits of the above surgical intervention, as well as alternative non-operative treatments. All questions/concerns were addressed and the patient/family demonstrated appropriate understanding of the diagnosis, the procedure, the postoperative course, and overall prognosis. The patient wished to proceed with surgical intervention and signed an informed surgical consent as such, in each others presence prior to surgery.   PROCEDURE IN DETAIL: After preoperative consent was obtained and the correct operative site was identified, the patient was brought to the operating room supine on stretcher and transferred onto operating table. General anesthesia was induced. Preoperative antibiotics were administered. Surgical timeout was taken. The patient was then positioned supine with an ipsilateral hip bump. The operative lower extremity was prepped and draped in standard sterile fashion with a tourniquet around the thigh. The extremity was exsanguinated and the tourniquet was inflated to 275 mmHg.  A standard lateral incision was made over the distal fibula. Dissection was carried  down to the level of the fibula and the fracture site identified. The superficial peroneal nerve was identified and protected throughout the procedure. The fibula was noted to be shortened with interposed periosteum. The fibula was brought out to length. The fibula fracture was debrided and the edges defined to achieve cortical read. Reduction maneuver was performed using pointed reduction forceps and lobster forceps. In this manner, the fibula length was restored and fracture reduced. A lag screw was not placed given the orientation of fracture lines and comminution. Due to poor bone quality and extensive comminution at the fracture site, it was decided to use a locking distal fibula plate. We then selected a Zimmer locking plate to match the anatomy of the distal fibula and placed it laterally. This was implanted under intraoperative fluoroscopy with a combination of distal locking screws and proximal cortical & locking screws.  The posterior malleolus fracture was reduced as verified directly fluoroscopically. This piece was too comminuted to receive internal fixation.  A manual external rotation stress radiograph was obtained and demonstrated widening of the ankle mortise. Given this intraoperative finding as well as preoperative subluxation, it was decided to reduce and fix the syndesmosis. Therefore a suture fixation system (ZipTight device) was implanted through the fibula plate in cannulated fashion to fix the syndesmosis. Anchor/button position was verified along anteromedial tibial cortex by fluoroscopy. A repeat stress radiograph showed complete stability of the ankle mortise to testing.  The surgical sites were thoroughly irrigated. The tourniquet was deflated and hemostasis achieved. Betadine and vancomycin powder were applied. The deep layers were closed using 2-0 vicryl. The skin was closed without tension.    The leg was cleaned with saline and sterile dressings with gauze were applied. A  well padded bulky short leg splint was applied. The  patient was awakened from anesthesia and transported to the recovery room in stable condition.    FOLLOW UP PLAN: -transfer to PACU, then return to RNF -strict NWB operative extremity, maximum elevation -maintain short leg splint until follow up -DVT ppx: Aspirin 81 mg twice daily while NWB or per primary team -Pain rx: sent to outpatient pharmacy on file thru external EMR (Oxycodone 5 mg q4-6 hr x 5 days) -follow up as outpatient within 7-10 days for wound check with exchange of short leg splint to short leg cast -sutures out in 2-3 weeks in outpatient office   RADIOGRAPHS: AP, lateral, oblique and stress radiographs of the left ankle were obtained intraoperatively. These showed interval reduction and fixation of the fractures. Manual stress radiographs were taken and the ankle mortise and tibiofibular relationship were noted to be stable following fixation. All hardware is appropriately positioned and of the appropriate lengths. No other acute injuries are noted.   Netta Cedars [facility]

## 2023-05-21 NOTE — Hospital Course (Signed)
Patient is a 67 years old male with past medical history of depression, hypertension, schizophrenia and tobacco dependence presented to hospital initially on 05/17/2023 with twisting of his ankle while trying to get out of his truck with significant pain.  On presentation he had hematoma and blisters.  He was unable to bear weight.  Patient had fracture of the left ankle but without any open fracture.  Patient was given sedation in the ED and fracture was reduced, splint was placed and was advised nonweightbearing on crutches with outpatient follow-up with orthopedics.  Patient then was discharged home.  Patient did have follow-up with orthopedics today and he had been weightbearing on his dislocated ankle.  He is without any social support.  Orthopedic requested direct admission to hospital for medical optimization perioperatively and hopefully surgical intervention in the next day or 2.    Assessment and Plan   Close left ankle trimalleolar fracture.  Seen by orthopedic in the clinic by Dr. Odis Hollingshead. Likely will need surgical intervention.  Continue conservative treatment for now.  Continue splint, pain management.   History of hypertension.  Continue amlodipine.  On as  needed hydralazine.   History of schizophrenia.  On trazodone.  Will continue.   History of diabetes mellitus as per the patient.  continue with Jardiance, add sliding scale insulin.   History of tobacco dependence. Not smoking currently.   Social issues.  Patient does not have much support social support and lives alone.  Unable to weight-bear.  Might need rehabilitation/TOC help on discharge.  Consult TOC.

## 2023-05-21 NOTE — Progress Notes (Signed)
PROGRESS NOTE    Duane Hancock  QIO:[id number] DOB: 1956-09-01 DOA: 05/20/2023 PCP: Fleet Contras, MD    Brief Narrative:  Patient is a 67 years old male with past medical history of depression, hypertension, schizophrenia and tobacco dependence presented to hospital initially on 05/17/2023 with twisting of his ankle while trying to get out of his truck with significant pain.  On presentation he had hematoma and blisters.  He was unable to bear weight.  Patient had fracture of the left ankle but without any open fracture.  Patient was given sedation in the ED and fracture was reduced, splint was placed and was advised nonweightbearing on crutches with outpatient follow-up with orthopedics.  Patient then was discharged home.  Patient did have follow-up with orthopedics today and he had been weightbearing on his dislocated ankle.  He is without any social support.  Orthopedic requested direct admission to hospital for medical optimization perioperatively and  surgical intervention .    Assessment and Plan   Close left ankle trimalleolar fracture.   Seen by orthopedic in the clinic by Dr. Odis Hollingshead. Likely will need surgical intervention.  Continue conservative treatment for now.  Continue splint, pain management.  Communicated with orthopedics about the patient.  History of hypertension.  Continue amlodipine.  On as  needed hydralazine.   History of schizophrenia.  On trazodone.  Will continue.   History of diabetes mellitus .  continue with Jardiance, continue sliding scale insulin.  Hemoglobin A1c of 8.4.   History of tobacco dependence. Not smoking currently.   Social issues.  Patient does not have much support social support and lives alone.  Unable to weight-bear.  Might need rehabilitation/TOC help on discharge.  Consult TOC.     DVT prophylaxis:    Code Status:     Code Status: Full Code  Disposition: Uncertain at this time.  Status is: Inpatient  Remains inpatient  appropriate because: Left ankle fracture, need for surgical intervention.   Family Communication: None at bedside  Consultants:  Orthopedics  Procedures:  None yet  Antimicrobials:  None  Anti-infectives (From admission, onward)    None      Subjective: Today, patient was seen and examined at bedside.  Patient complains of mild pain at the fracture site.  Feels sleepy  Objective: Vitals:   05/20/23 1751 05/20/23 2032 05/21/23 0448 05/21/23 0747  BP:  (!) 146/81 (!) 118/95 (!) 144/52  Pulse:  83 (!) 58 71  Resp:  18 18 18   Temp:  98.3 F (36.8 C) 97.6 F (36.4 C) 98.5 F (36.9 C)  TempSrc:  Oral  Oral  SpO2:  97% 97% 98%  Weight: 111.1 kg     Height: 6' (1.829 m)      No intake or output data in the 24 hours ending 05/21/23 1404 Filed Weights   05/20/23 1751  Weight: 111.1 kg    Physical Examination: Body mass index is 33.23 kg/m.   General: Obese built, not in obvious distress HENT:   No scleral pallor or icterus noted. Oral mucosa is moist.  Chest:  Clear breath sounds.  No crackles or wheezes.  CVS: S1 &S2 heard. No murmur.  Regular rate and rhythm. Abdomen: Soft, nontender, nondistended.  Bowel sounds are heard.   Extremities: No cyanosis, clubbing or edema.  Left lower extremity in a splint. Psych: Alert, awake and Communicative, flat affect CNS:  No cranial nerve deficits.  Power equal in all extremities.   Skin: Warm and dry.  No  rashes noted.  Data Reviewed:   CBC: Recent Labs  Lab 05/21/23 0051  WBC 6.6  HGB 11.9*  HCT 36.4*  MCV 88.3  PLT 142*    Basic Metabolic Panel: Recent Labs  Lab 05/21/23 0051  NA 137  K 3.9  CL 100  CO2 24  GLUCOSE 106*  BUN 18  CREATININE 1.03  CALCIUM 8.9    Liver Function Tests: No results for input(s): "AST", "ALT", "ALKPHOS", "BILITOT", "PROT", "ALBUMIN" in the last 168 hours.   Radiology Studies: CT ANKLE LEFT WO CONTRAST  Result Date: 05/20/2023 CLINICAL DATA:  Ankle trauma, fracture,  xray done (Age >= 5y) EXAM: CT OF THE LEFT ANKLE WITHOUT CONTRAST TECHNIQUE: Multidetector CT imaging of the left ankle was performed according to the standard protocol. Multiplanar CT image reconstructions were also generated. RADIATION DOSE REDUCTION: This exam was performed according to the departmental dose-optimization program which includes automated exposure control, adjustment of the mA and/or kV according to patient size and/or use of iterative reconstruction technique. COMPARISON:  Radiographs 05/17/2023 FINDINGS: Bones/Joint/Cartilage Oblique displaced fibular fracture, fracture displacement of 6 mm. Fracture is at the level of the ankle mortise. Minimally displaced and comminuted posterior tibial tubercle fracture. There is lateral talar tilt. Widening of the medial clear space with tiny ossific densities adjacent to the talus can be seen in the setting of ligamentous injury. Tiny punctate ossific densities in the joint space anteriorly. Tarsal bones are intact. Os navicular. Ligaments Suboptimally assessed by CT. Muscles and Tendons Thickening of the peroneal tendons at the fracture site. No evidence of tendinous disruption. No intramuscular hematoma. Intact Achilles tendon with small enthesophyte. Soft tissues Generalized soft tissue edema. IMPRESSION: 1. Oblique displaced fibular fracture, fracture displacement of 6 mm. 2. Minimally displaced and comminuted posterior tibial tubercle fracture. 3. Widening of the medial ankle mortise with tiny adjacent ossific densities can be seen in the setting of ligamentous injury/avulsion type fracture. Tiny punctate ossific densities in the joint space anteriorly. 4. Talar tilt with widening of the medial ankle mortise. Electronically Signed   By: Narda Rutherford M.D.   On: 05/20/2023 21:18      LOS: 1 day     Joycelyn Das, MD [facility] Available via Epic secure chat 7am-7pm After these hours, please refer to coverage provider listed on  [email or web address] 05/21/2023, 2:04 PM

## 2023-05-21 NOTE — Anesthesia Procedure Notes (Signed)
Anesthesia Regional Block: Popliteal block   Pre-Anesthetic Checklist: , timeout performed,  Correct Patient, Correct Site, Correct Laterality,  Correct Procedure, Correct Position, site marked,  Risks and benefits discussed,  Surgical consent,  Pre-op evaluation,  At surgeon's request and post-op pain management  Laterality: Left  Prep: chloraprep       Needles:  Injection technique: Single-shot  Needle Type: Echogenic Needle     Needle Length: 9cm  Needle Gauge: 21     Additional Needles:   Procedures:,,,, ultrasound used (permanent image in chart),,    Narrative:  Start time: 05/21/2023 5:45 PM End time: 05/21/2023 5:50 PM Injection made incrementally with aspirations every 5 mL.  Performed by: Personally  Anesthesiologist: Collene Schlichter, MD  Additional Notes: No pain on injection. No increased resistance to injection. Injection made in 5cc increments.  Good needle visualization.  Patient tolerated procedure well.

## 2023-05-22 DIAGNOSIS — S82852A Displaced trimalleolar fracture of left lower leg, initial encounter for closed fracture: Secondary | ICD-10-CM | POA: Diagnosis not present

## 2023-05-22 DIAGNOSIS — I10 Essential (primary) hypertension: Secondary | ICD-10-CM | POA: Diagnosis not present

## 2023-05-22 DIAGNOSIS — R262 Difficulty in walking, not elsewhere classified: Secondary | ICD-10-CM | POA: Diagnosis not present

## 2023-05-22 MED ORDER — MUPIROCIN 2 % EX OINT
TOPICAL_OINTMENT | Freq: Two times a day (BID) | CUTANEOUS | Status: DC
  Filled 2023-05-22: qty 22

## 2023-05-22 MED ORDER — ENSURE ENLIVE PO LIQD
237.0000 mL | Freq: Two times a day (BID) | ORAL | Status: DC
  Administered 2023-05-23 (×2): 237 mL via ORAL

## 2023-05-22 MED ORDER — ASPIRIN 81 MG PO CHEW
81.0000 mg | CHEWABLE_TABLET | Freq: Two times a day (BID) | ORAL | Status: DC
  Administered 2023-05-22 – 2023-05-23 (×3): 81 mg via ORAL
  Filled 2023-05-22 (×3): qty 1

## 2023-05-22 MED ORDER — CHLORHEXIDINE GLUCONATE CLOTH 2 % EX PADS
6.0000 | MEDICATED_PAD | Freq: Every day | CUTANEOUS | Status: DC
  Administered 2023-05-22: 6 via TOPICAL

## 2023-05-22 NOTE — Plan of Care (Signed)

## 2023-05-22 NOTE — Progress Notes (Signed)
PROGRESS NOTE    Duane Hancock  ZOX:[id number] DOB: 13-Jun-1956 DOA: 05/20/2023 PCP: Fleet Contras, MD    Brief Narrative:  Patient is a 67 years old male with past medical history of depression, hypertension, schizophrenia and tobacco dependence presented to hospital initially on 05/17/2023 with twisting of his ankle while trying to get out of his truck with significant pain.  On presentation, he had hematoma and blisters.  He was unable to bear weight.  Patient had fracture of the left ankle but without any open fracture.  Patient was given sedation in the ED and fracture was reduced, splint was placed and was advised nonweightbearing on crutches with outpatient follow-up with orthopedics.  Patient then was discharged home.  Patient did have follow-up with orthopedics  and he had been weightbearing on his dislocated ankle.  He is without any social support.  Orthopedic requested direct admission to hospital for medical optimization perioperatively and  surgical intervention .    Assessment and Plan   Close left ankle trimalleolar fracture status post ORIF 05/21/2023.   Seen by orthopedic in the clinic by Dr. Odis Hollingshead and underwent ORIF on 05/20/2021.   Continue  pain management.  Orthopedic recommended strict nonweightbearing to the operative extremity with short leg splint.  Recommend aspirin twice daily for DVT prophylaxis.  Plan is to follow-up outpatient in 7 to 10 days for wound check and exchange of short leg splint to short leg cast.  Plan to remove sutures in 2 to 3 weeks in outpatient.  Spoke with physical therapy and patient will need to continue to practice for independent ambulation and nonweightbearing.  Likely plan for disposition home 05/23/2023.  History of hypertension.  Continue amlodipine.  On as  needed hydralazine.   History of schizophrenia.  Continue trazodone  History of diabetes mellitus .  continue with Jardiance, continue sliding scale insulin.  Hemoglobin A1c of 8.4.    History of tobacco dependence. Not smoking currently.   Social issues.  Patient does not have much support social support and lives alone.  TOC on board.  PT has recommended home health at this time.   DVT prophylaxis: Aspirin 81 mg twice daily.   Code Status:     Code Status: Full Code  Disposition: Home with home health likely 05/23/2023 if able to do more activity including stairs.  Status is: Inpatient  Remains inpatient appropriate because: Left ankle fracture, status post ORIF    Family Communication:  Spoke with the patient's friend and contact and updated him about the clinical condition of the patient and potential disposition on 05/22/2021.  Consultants:  Orthopedics  Procedures:  Left ankle ORIF 05/22/23  Antimicrobials:  None   Subjective:  Today, patient was seen and examined at bedside.  Denies any shortness of breath chest pain fever or chills.  Complains of mild pain on the surgical site.  Denies any tingling or numbness.  Objective: Vitals:   05/21/23 2104 05/22/23 0423 05/22/23 0815 05/22/23 0817  BP: (!) 150/80 (!) 122/53 (!) 146/83 (!) 146/83  Pulse: 74 61  74  Resp: 18 18  18   Temp: 97.7 F (36.5 C) 97.6 F (36.4 C)  98.4 F (36.9 C)  TempSrc: Oral Oral  Oral  SpO2: 95% 96%  97%  Weight:      Height:        Intake/Output Summary (Last 24 hours) at 05/22/2023 1304 Last data filed at 05/22/2023 1206 Gross per 24 hour  Intake 2556.46 ml  Output 1300 ml  Net 1256.46 ml   Filed Weights   05/20/23 1751 05/21/23 1518  Weight: 111.1 kg 108.9 kg    Physical Examination: Body mass index is 32.55 kg/m.   General: Obese built, not in obvious distress HENT:   No scleral pallor or icterus noted. Oral mucosa is moist.  Chest:  Clear breath sounds.  No crackles or wheezes.  CVS: S1 &S2 heard. No murmur.  Regular rate and rhythm. Abdomen: Soft, nontender, nondistended.  Bowel sounds are heard.   Extremities: No cyanosis, clubbing or edema.  Lower  extremity with short leg splint in place.  Able to wiggle toes with distal capillary refill. Psych: Alert, awake and Communicative, flat affect CNS:  No cranial nerve deficits.  Moves all extremities. Skin: Warm and dry.  No rashes noted.  Data Reviewed:   CBC: Recent Labs  Lab 05/21/23 0051 05/22/23 0139  WBC 6.6 5.9  HGB 11.9* 12.1*  HCT 36.4* 35.9*  MCV 88.3 87.3  PLT 142* 125*    Basic Metabolic Panel: Recent Labs  Lab 05/21/23 0051 05/22/23 0139  NA 137 136  K 3.9 3.9  CL 100 104  CO2 24 22  GLUCOSE 106* 213*  BUN 18 17  CREATININE 1.03 1.04  CALCIUM 8.9 8.6*  MG  --  1.9    Liver Function Tests: No results for input(s): "AST", "ALT", "ALKPHOS", "BILITOT", "PROT", "ALBUMIN" in the last 168 hours.   Radiology Studies: DG Ankle Complete Left  Result Date: 05/21/2023 CLINICAL DATA:  Upper reduction internal fixation. EXAM: LEFT ANKLE COMPLETE - 3+ VIEW COMPARISON:  Preoperative imaging. FINDINGS: Four fluoroscopic spot views of the left ankle obtained in the operating room. Lateral plate and screw fixation of distal fibular fracture with syndesmotic trapeze. Fluoroscopy time 1 minute. Fluoroscopy dose 1.79 mGy. IMPRESSION: Intraoperative fluoroscopy during ORIF of distal fibular fracture. Electronically Signed   By: Narda Rutherford M.D.   On: 05/21/2023 20:27   DG C-Arm 1-60 Min-No Report  Result Date: 05/21/2023 Fluoroscopy was utilized by the requesting physician.  No radiographic interpretation.   CT ANKLE LEFT WO CONTRAST  Result Date: 05/20/2023 CLINICAL DATA:  Ankle trauma, fracture, xray done (Age >= 5y) EXAM: CT OF THE LEFT ANKLE WITHOUT CONTRAST TECHNIQUE: Multidetector CT imaging of the left ankle was performed according to the standard protocol. Multiplanar CT image reconstructions were also generated. RADIATION DOSE REDUCTION: This exam was performed according to the departmental dose-optimization program which includes automated exposure control,  adjustment of the mA and/or kV according to patient size and/or use of iterative reconstruction technique. COMPARISON:  Radiographs 05/17/2023 FINDINGS: Bones/Joint/Cartilage Oblique displaced fibular fracture, fracture displacement of 6 mm. Fracture is at the level of the ankle mortise. Minimally displaced and comminuted posterior tibial tubercle fracture. There is lateral talar tilt. Widening of the medial clear space with tiny ossific densities adjacent to the talus can be seen in the setting of ligamentous injury. Tiny punctate ossific densities in the joint space anteriorly. Tarsal bones are intact. Os navicular. Ligaments Suboptimally assessed by CT. Muscles and Tendons Thickening of the peroneal tendons at the fracture site. No evidence of tendinous disruption. No intramuscular hematoma. Intact Achilles tendon with small enthesophyte. Soft tissues Generalized soft tissue edema. IMPRESSION: 1. Oblique displaced fibular fracture, fracture displacement of 6 mm. 2. Minimally displaced and comminuted posterior tibial tubercle fracture. 3. Widening of the medial ankle mortise with tiny adjacent ossific densities can be seen in the setting of ligamentous injury/avulsion type fracture. Tiny punctate ossific densities in the  joint space anteriorly. 4. Talar tilt with widening of the medial ankle mortise. Electronically Signed   By: Narda Rutherford M.D.   On: 05/20/2023 21:18      LOS: 2 days     Joycelyn Das, MD [facility] Available via Epic secure chat 7am-7pm After these hours, please refer to coverage provider listed on [email or web address] 05/22/2023, 1:04 PM

## 2023-05-22 NOTE — Progress Notes (Signed)
Subjective: 1 Day Post-Op Procedure(s) (LRB): OPEN REDUCTION INTERNAL FIXATION (ORIF) ANKLE FRACTURE TRIMAL WITH SYNDESMOSIS (Left)  Patient reports pain as appropriately controlled. Denies any new numbness/tingling.   Objective:   VITALS:  Temp:  [97.6 F (36.4 C)-98.4 F (36.9 C)] 98.4 F (36.9 C) (08/08 0817) Pulse Rate:  [61-78] 74 (08/08 0817) Resp:  [11-18] 18 (08/08 0817) BP: (122-161)/(53-83) 146/83 (08/08 0817) SpO2:  [92 %-97 %] 97 % (08/08 0817) Weight:  [108.9 kg] 108.9 kg (08/07 1518)  Gen: AAOx3, NAD  Left lower extremity: Well padded short leg splint in place Wiggles toes SILT over toes CR<2s    LABS Recent Labs    05/21/23 0051 05/22/23 0139  HGB 11.9* 12.1*  WBC 6.6 5.9  PLT 142* 125*   Recent Labs    05/21/23 0051 05/22/23 0139  NA 137 136  K 3.9 3.9  CL 100 104  CO2 24 22  BUN 18 17  CREATININE 1.03 1.04  GLUCOSE 106* 213*   No results for input(s): "LABPT", "INR" in the last 72 hours.   Assessment/Plan: 1 Day Post-Op Procedure(s) (LRB): OPEN REDUCTION INTERNAL FIXATION (ORIF) ANKLE FRACTURE TRIMAL WITH SYNDESMOSIS (Left)  -stable on RNF -strict NWB operative extremity, maximum elevation -maintain short leg splint until follow up -DVT ppx: Aspirin 81 mg twice daily while NWB or per primary team -Pain rx: sent to outpatient pharmacy on file thru external EMR (Oxycodone 5 mg q4-6 hr x 5 days) -follow up as outpatient within 7-10 days for wound check with exchange of short leg splint to short leg cast -sutures out in 2-3 weeks in outpatient office  Netta Cedars 05/22/2023, 11:28 AM

## 2023-05-22 NOTE — TOC Initial Note (Addendum)
Transition of Care [facility]) - Initial/Assessment Note    Patient Details  Name: Duane Hancock MRN: [id number] Date of Birth: 01-Mar-1956  [facility]) CM/SW Contact:    Epifanio Lesches, RN Phone Number: 05/22/2023, 11:59 AM  Clinical Narrative:                   -s/p OPEN REDUCTION INTERNAL FIXATION (ORIF) ANKLE FRACTURE TRIMAL WITH SYNDESMOSIS (Left) , 8/7  From home alone. PTA independent with ADL's , no DME usage. Lives in a one story home. States home with 8-9 steps to enter.  Supportive friends.  Pt states has transportation to home once d/c ready.  PT evaluation pending...    TOC team following for needs.   Expected Discharge Plan: Home/Self Care Barriers to Discharge: Continued Medical Work up   Patient Goals and CMS Choice            Expected Discharge Plan and Services   Discharge Planning Services: CM Consult                                          Prior Living Arrangements/Services   Lives with:: Self Patient language and need for interpreter reviewed:: Yes        Need for Family Participation in Patient Care: No (Comment) Care giver support system in place?: No (comment)   Criminal Activity/Legal Involvement Pertinent to Current Situation/Hospitalization: No - Comment as needed  Activities of Daily Living Home Assistive Devices/Equipment: Walker (specify type) ADL Screening (condition at time of admission) Patient's cognitive ability adequate to safely complete daily activities?: Yes Is the patient deaf or have difficulty hearing?: No Does the patient have difficulty seeing, even when wearing glasses/contacts?: No Does the patient have difficulty concentrating, remembering, or making decisions?: No Patient able to express need for assistance with ADLs?: No Does the patient have difficulty dressing or bathing?: Yes Independently performs ADLs?: Yes (appropriate for developmental age) Communication: Independent Dressing  (OT): Independent Grooming: Independent Feeding: Independent Bathing: Independent Toileting: Independent In/Out Bed: Independent with device (comment) Walks in Home: Independent with device (comment) Does the patient have difficulty walking or climbing stairs?: Yes (with dislocation of R ankle) Weakness of Legs: Right Weakness of Arms/Hands: None  Permission Sought/Granted                  Emotional Assessment Appearance:: Appears stated age Attitude/Demeanor/Rapport: Engaged Affect (typically observed): Accepting Orientation: : Oriented to Self, Oriented to Place, Oriented to  Time, Oriented to Situation Alcohol / Substance Use: Not Applicable Psych Involvement: No (comment)  Admission diagnosis:  Trimalleolar fracture of ankle, closed [S82.853A] Patient Active Problem List   Diagnosis Date Noted   Trimalleolar fracture of ankle, closed 05/20/2023   Ambulatory dysfunction 05/20/2023   Hypertension    SCHIZOPHRENIA 12/11/2006   TOBACCO DEPENDENCE 12/11/2006   GASTROESOPHAGEAL REFLUX, NO ESOPHAGITIS 12/11/2006   PCP:  Fleet Contras, MD Pharmacy:   [facility] [phone] - [place], [place] - [address] AT SEC OF [address] [place] [address] Phone: [phone] Fax: [phone]  [id number] - [place], [place] - [address] AT [facility] [address] [facility] [address] [place] [address] Phone: [phone] Fax: [phone]     Social Determinants of Health (SDOH) Social History: SDOH Screenings  Food Insecurity: No Food Insecurity (05/20/2023)  Housing: Low Risk  (05/20/2023)  Transportation Needs: No Transportation Needs (05/20/2023)  Utilities: Not At Risk (05/20/2023)  Tobacco Use: Medium Risk (05/21/2023)   SDOH Interventions:     Readmission Risk Interventions     No data to display

## 2023-05-22 NOTE — Progress Notes (Signed)
Initial Nutrition Assessment  DOCUMENTATION CODES:   Obesity unspecified  INTERVENTION:  Encourage adequate PO intake with emphasis on protein to support post-op healing Ensure Enlive po BID, each supplement provides 350 kcal and 20 grams of protein.  NUTRITION DIAGNOSIS:   Increased nutrient needs related to post-op healing as evidenced by estimated needs  GOAL:   Patient will meet greater than or equal to 90% of their needs  MONITOR:   PO intake, Supplement acceptance, Labs, Weight trends  REASON FOR ASSESSMENT:   Consult Assessment of nutrition requirement/status  ASSESSMENT:   Pt admitted with ankle pain secondary to closed trimalleolar fracture of ankle. PMH significant for depression, HTN, schizophrenia and tobacco dependence.   8/7 - s/p ORIF of L trimalleolar ankle fracture and L ankle syndesmosis  Attempted to speak with pt at bedside. Pt not forthcoming with nutrition related history. Just reports that he is hungry as he had not eaten as of time of visit. Reports having a great appetite. During visit his friend entered and brought 2 egg McMuffins from [employer]. Pt prefers automatic trays as he does not want to order meals himself. Provided Ensure for pt to try for added protein to support post-op healing.   No documented meal completions on file to review.   Unfortunately, there is limited documentation of weight history on file. Pt's weight was 117.9 kg 10/2017 and 108.9 kg on 08/03.   Medications: colace, jardiance, protonix  Labs: HgbA1c 8.4%, CBG's 96-150 x24 hours  NUTRITION - FOCUSED PHYSICAL EXAM: Deferred to follow up.   Diet Order:   Diet Order             Diet regular Room service appropriate? Yes; Fluid consistency: Thin  Diet effective now                   EDUCATION NEEDS:   Education needs have been addressed  Skin:  Skin Assessment: Reviewed RN Assessment (L ankle closed incision)  Last BM:  8/7  Height:   Ht Readings from  Last 1 Encounters:  05/21/23 6' (1.829 m)    Weight:   Wt Readings from Last 1 Encounters:  05/21/23 108.9 kg   BMI:  Body mass index is 32.55 kg/m.  Estimated Nutritional Needs:   Kcal:  2100-2300  Protein:  110-125g  Fluid:  >/=2L  Drusilla Kanner, RDN, LDN Clinical Nutrition

## 2023-05-22 NOTE — Evaluation (Signed)
Physical Therapy Evaluation Patient Details Name: Duane Hancock MRN: [id number] DOB: February 10, 1956 Today's Date: 05/22/2023  History of Present Illness  Pt is a 67 year old male admitted on 05/20/23 with L ankle fx and is now s/p ORIF on 05/21/23. Past medical history of depression, hypertension, schizophrenia and tobacco dependence presented to hospital initially on 05/17/2023 with twisting of his ankle while trying to get out of his truck with significant pain.  On presentation he had hematoma and blisters.  He was unable to bear weight.  Patient had fracture of the left ankle but without any open fracture.  Patient was given sedation in the ED and fracture was reduced, splint was placed and was advised nonweightbearing on crutches with outpatient follow-up with orthopedics.  Patient then was discharged home.  Patient did have follow-up with orthopedics today and he had been weightbearing on his dislocated ankle.  Clinical Impression  Pt presents with admitting diagnosis above. Pt today was able to ambulate around room with RW supervision level while able to maintain WB precautions. At home pt has 8-9 steps to enter in the front however reports that he does have a WC ramp in the back but it is very far from the driveway. Pt reports that if he were to use the ramp he would need a WC due to terrain of his backyard instead of RW. Recommend HHPT with RW and WC upon DC. Practice stairs with pt tomorrow.       If plan is discharge home, recommend the following: A little help with walking and/or transfers;A little help with bathing/dressing/bathroom;Help with stairs or ramp for entrance;Assist for transportation;Assistance with cooking/housework   Can travel by private vehicle        Equipment Recommendations Rolling walker (2 wheels);Wheelchair (measurements PT);Wheelchair cushion (measurements PT)  Recommendations for Other Services       Functional Status Assessment Patient has had a recent decline in  their functional status and demonstrates the ability to make significant improvements in function in a reasonable and predictable amount of time.     Precautions / Restrictions Precautions Precautions: Fall Restrictions Weight Bearing Restrictions: Yes LLE Weight Bearing: Non weight bearing      Mobility  Bed Mobility Overal bed mobility: Modified Independent                  Transfers Overall transfer level: Needs assistance Equipment used: Rolling walker (2 wheels) Transfers: Sit to/from Stand Sit to Stand: Contact guard assist           General transfer comment: CGA for safety. Cues for hand placement and bed elevated    Ambulation/Gait Ambulation/Gait assistance: Supervision Gait Distance (Feet): 15 Feet Assistive device: Rolling walker (2 wheels) Gait Pattern/deviations:  (Hop to) Gait velocity: decreased     General Gait Details: Pt agreeable to short ambulation around room. Pt able to maintain WB precautions.  Stairs            Wheelchair Mobility     Tilt Bed    Modified Rankin (Stroke Patients Only)       Balance Overall balance assessment: Needs assistance Sitting-balance support: Bilateral upper extremity supported Sitting balance-Leahy Scale: Good     Standing balance support: Bilateral upper extremity supported, During functional activity, Reliant on assistive device for balance Standing balance-Leahy Scale: Poor Standing balance comment: Reliant on RW  Pertinent Vitals/Pain Pain Assessment Pain Assessment: No/denies pain    Home Living Family/patient expects to be discharged to:: Private residence Living Arrangements: Alone Available Help at Discharge: Friend(s);Available PRN/intermittently Type of Home: House Home Access: Stairs to enter;Ramped entrance (States that he has ramp out back but it is very far from driveway.) Entrance Stairs-Rails: Doctor, general practice of  Steps: 9   Home Layout: One level Home Equipment: Crutches      Prior Function Prior Level of Function : Independent/Modified Independent;Driving             Mobility Comments: Ind no AD ADLs Comments: Ind     Extremity/Trunk Assessment   Upper Extremity Assessment Upper Extremity Assessment: Overall WFL for tasks assessed    Lower Extremity Assessment Lower Extremity Assessment: LLE deficits/detail LLE Deficits / Details: L ankle fx    Cervical / Trunk Assessment Cervical / Trunk Assessment: Normal  Communication   Communication Communication: No apparent difficulties Cueing Techniques: Verbal cues;Tactile cues  Cognition Arousal: Alert Behavior During Therapy: WFL for tasks assessed/performed Overall Cognitive Status: Within Functional Limits for tasks assessed                                          General Comments General comments (skin integrity, edema, etc.): VSS on RA    Exercises     Assessment/Plan    PT Assessment Patient needs continued PT services  PT Problem List Decreased strength;Decreased activity tolerance;Decreased range of motion;Decreased balance;Decreased mobility;Decreased coordination;Decreased knowledge of use of DME;Decreased safety awareness;Decreased knowledge of precautions;Pain       PT Treatment Interventions DME instruction;Gait training;Stair training;Functional mobility training;Therapeutic activities;Therapeutic exercise;Balance training;Neuromuscular re-education;Patient/family education    PT Goals (Current goals can be found in the Care Plan section)  Acute Rehab PT Goals Patient Stated Goal: to go home PT Goal Formulation: With patient Time For Goal Achievement: 06/05/23 Potential to Achieve Goals: Good    Frequency Min 1X/week     Co-evaluation               AM-PAC PT "6 Clicks" Mobility  Outcome Measure Help needed turning from your back to your side while in a flat bed without using  bedrails?: None Help needed moving from lying on your back to sitting on the side of a flat bed without using bedrails?: None Help needed moving to and from a bed to a chair (including a wheelchair)?: A Little Help needed standing up from a chair using your arms (e.g., wheelchair or bedside chair)?: A Little Help needed to walk in hospital room?: A Little Help needed climbing 3-5 steps with a railing? : A Lot 6 Click Score: 19    End of Session Equipment Utilized During Treatment: Gait belt Activity Tolerance: Patient tolerated treatment well Patient left: in bed;with call bell/phone within reach Nurse Communication: Mobility status PT Visit Diagnosis: Other abnormalities of gait and mobility (R26.89)    Time: 1610-9604 PT Time Calculation (min) (ACUTE ONLY): 21 min   Charges:   PT Evaluation $PT Eval Low Complexity: 1 Low   PT General Charges $$ ACUTE PT VISIT: 1 Visit         Shela Nevin, PT, DPT Acute Rehab Services [phone]   Gladys Damme 05/22/2023, 12:32 PM

## 2023-05-22 NOTE — Plan of Care (Signed)

## 2023-05-23 ENCOUNTER — Encounter (HOSPITAL_COMMUNITY): Payer: Self-pay | Admitting: Orthopaedic Surgery

## 2023-05-23 DIAGNOSIS — S82852A Displaced trimalleolar fracture of left lower leg, initial encounter for closed fracture: Secondary | ICD-10-CM | POA: Diagnosis not present

## 2023-05-23 DIAGNOSIS — I10 Essential (primary) hypertension: Secondary | ICD-10-CM | POA: Diagnosis not present

## 2023-05-23 DIAGNOSIS — R262 Difficulty in walking, not elsewhere classified: Secondary | ICD-10-CM | POA: Diagnosis not present

## 2023-05-23 MED ORDER — ASPIRIN 81 MG PO CHEW: 81.0000 mg | CHEWABLE_TABLET | Freq: Two times a day (BID) | ORAL | 0 refills | Status: AC

## 2023-05-23 NOTE — Progress Notes (Signed)
Physical Therapy Treatment Patient Details Name: Duane Hancock MRN: [id number] DOB: February 03, 1956 Today's Date: 05/23/2023   History of Present Illness Pt is a 67 year old male admitted on 05/20/23 with L ankle fx and is now s/p ORIF on 05/21/23. Past medical history of depression, hypertension, schizophrenia and tobacco dependence. Initially presenting on 05/17/2023 with twisting of his ankle while trying to get out of his truck with significant pain, unable to bear weight, found to have ankle fx, and d/c home NWB. Pt presented to follow-up appointment not adhering to weightbearing status.    PT Comments  The pt was seen for final training and education prior to anticipated d/c. The pt was attempting to stand upon arrival of PT, was using BLE on the ground and therefore lengthy and repeated education on maintaining LLE NWB with transfers and gait. The pt was able to use a RW for transfers and short distance mobility in the room without physical assist, but utilizes short, quick movements and is heavily dependent on BUE support to maintain upright, limited to short distances due to rapid fatigue. The pt was then able to complete transfer to [facility], and educated on how to steer, navigate, and use all safety features. The pt plans to use WC to enter house via WC ramp, and to navigate in his home, but reports it wont fit in bathroom where he will have to use RW for short distance mobility. Continue to recommend skilled PT to progress stability, endurance, and independence with mobility as well as to optimize home set up and safety with mobility at home.     If plan is discharge home, recommend the following: A little help with walking and/or transfers;A little help with bathing/dressing/bathroom;Help with stairs or ramp for entrance;Assist for transportation;Assistance with cooking/housework   Can travel by private vehicle        Equipment Recommendations  Rolling walker (2 wheels);Wheelchair (measurements  PT);Wheelchair cushion (measurements PT)    Recommendations for Other Services       Precautions / Restrictions Precautions Precautions: Fall Precaution Comments: poor adherence to weightbearing Restrictions Weight Bearing Restrictions: Yes LLE Weight Bearing: Non weight bearing     Mobility  Bed Mobility Overal bed mobility: Modified Independent                  Transfers Overall transfer level: Needs assistance Equipment used: Rolling walker (2 wheels) Transfers: Sit to/from Stand Sit to Stand: Contact guard assist           General transfer comment: CGA with repeated cues for NWB LLE, pt has tendency to rest it on ground    Ambulation/Gait Ambulation/Gait assistance: Supervision Gait Distance (Feet): 9 Feet Assistive device: Rolling walker (2 wheels) Gait Pattern/deviations:  (Hop to) Gait velocity: decreased     General Gait Details: hop-to pattern with BUE support on RW. poor stability, quick movements. advised to limit to short distances due to poor endurance.   Stairs Stairs:  (discussed verbally, pt would be unsafe hopping up stairs at this time, has WC ramp at back of his house he plans to use)           Merchant navy officer mobility: Yes Wheelchair propulsion: Both upper extremities Wheelchair parts: Needs assistance Distance: 100 Wheelchair Assistance Details (indicate cue type and reason): cues and education on WC parts, pt able to follow instructions and repeat later without cues       Balance Overall balance assessment: Needs assistance Sitting-balance support: Bilateral upper  extremity supported Sitting balance-Leahy Scale: Good     Standing balance support: Bilateral upper extremity supported, During functional activity, Reliant on assistive device for balance Standing balance-Leahy Scale: Poor Standing balance comment: Reliant on RW                            Cognition Arousal:  Alert Behavior During Therapy: WFL for tasks assessed/performed Overall Cognitive Status: Impaired/Different from baseline Area of Impairment: Safety/judgement, Memory                     Memory: Decreased recall of precautions   Safety/Judgement: Decreased awareness of safety, Decreased awareness of deficits     General Comments: unable to determine if poor memory of precautions or limited insight to complications if precautions not followed, but pt breaking NWB mulitple times in session despite repeated cues           General Comments General comments (skin integrity, edema, etc.): VSS on RA, all education completed and questions answered      Pertinent Vitals/Pain Pain Assessment Pain Assessment: Faces Faces Pain Scale: Hurts little more Pain Location: L ankle Pain Descriptors / Indicators: Discomfort Pain Intervention(s): Monitored during session, Limited activity within patient's tolerance, Repositioned     PT Goals (current goals can now be found in the care plan section) Acute Rehab PT Goals Patient Stated Goal: to go home PT Goal Formulation: With patient Time For Goal Achievement: 06/05/23 Potential to Achieve Goals: Good Progress towards PT goals: Progressing toward goals    Frequency    Min 1X/week       AM-PAC PT "6 Clicks" Mobility   Outcome Measure  Help needed turning from your back to your side while in a flat bed without using bedrails?: None Help needed moving from lying on your back to sitting on the side of a flat bed without using bedrails?: None Help needed moving to and from a bed to a chair (including a wheelchair)?: A Little Help needed standing up from a chair using your arms (e.g., wheelchair or bedside chair)?: A Little Help needed to walk in hospital room?: A Little Help needed climbing 3-5 steps with a railing? : A Lot 6 Click Score: 19    End of Session Equipment Utilized During Treatment: Gait belt Activity Tolerance:  Patient tolerated treatment well Patient left: in bed;with call bell/phone within reach Nurse Communication: Mobility status PT Visit Diagnosis: Other abnormalities of gait and mobility (R26.89)     Time: 4098-1191 PT Time Calculation (min) (ACUTE ONLY): 37 min  Charges:    $Gait Training: 8-22 mins $Wheel Chair Management: 8-22 mins PT General Charges $$ ACUTE PT VISIT: 1 Visit                     Vickki Muff, PT, DPT   Acute Rehabilitation Department Office [phone] Secure Chat Communication Preferred   Ronnie Derby 05/23/2023, 12:33 PM

## 2023-05-23 NOTE — Discharge Summary (Signed)
Physician Discharge Summary  Duane Hancock ZOX:[id number] DOB: May 09, 1956 DOA: 05/20/2023  PCP: Fleet Contras, MD  Admit date: 05/20/2023 Discharge date: 05/23/2023  Admitted From: Home  Discharge disposition: Home with home health  Recommendations for Outpatient Follow-Up:   Follow up with your primary care provider in one week.  Follow-up with Dr. Odis Hollingshead orthopedics in 1 week. Check CBC, BMP, magnesium in the next visit  Discharge Diagnosis:   Principal Problem:   Trimalleolar fracture of ankle, closed Active Problems:   Ambulatory dysfunction   Hypertension   Discharge Condition: Improved.  Diet recommendation: Regular diet.  Wound care: None.  Code status: Full.   History of Present Illness:   Patient is a 67 years old male with past medical history of depression, hypertension, schizophrenia and tobacco dependence presented to hospital initially on 05/17/2023 with twisting of his ankle while trying to get out of his truck with significant pain.  On presentation, he had hematoma and blisters.  He was unable to bear weight.  Patient had fracture of the left ankle but without any open fracture.  Patient was given sedation in the ED and fracture was reduced, splint was placed and was advised nonweightbearing on crutches with outpatient follow-up with orthopedics.  Patient then was discharged home.  Patient did have follow-up with orthopedics  and he had been weightbearing on his dislocated ankle.  He is without any social support.  Orthopedic requested direct admission to hospital for medical optimization perioperatively and  surgical intervention .    Hospital Course:   Following conditions were addressed during hospitalization as listed below,  Close left ankle trimalleolar fracture status post ORIF 05/21/2023.   Seen by orthopedic by Dr. Odis Hollingshead and underwent ORIF on 05/20/2021.     Orthopedic recommended strict nonweightbearing to the operative extremity with short  leg splint.  Recommend aspirin twice daily for DVT prophylaxis.  Plan is to follow-up outpatient in 7 to 10 days for wound check and exchange of short leg splint to short leg cast.  Plan to remove sutures in 2 to 3 weeks in outpatient.  Spoke with physical therapy and patient will need to continue to practice for independent ambulation and nonweightbearing.     History of hypertension.  Continue amlodipine.    History of schizophrenia.  Continue trazodone   History of diabetes mellitus .  continue with Jardiance,   Hemoglobin A1c of 8.4.   History of tobacco dependence. Not smoking currently.   Social issues.  Patient does not have much support social support and lives alone.  TOC on board.  PT has recommended home health at this time.   Disposition.  At this time, patient is stable for disposition home with home health.  Spoke with the patient's friend on the phone 05/22/2023 regarding potential disposition home  Medical Consultants:   Orthopedics  Procedures:    Left ankle ORIF 05/22/23 Subjective:   Today, patient was seen and examined at bedside.  Denies any overt pain, nausea, vomiting, fever, chills or rigor.  Discharge Exam:   Vitals:   05/23/23 0738 05/23/23 1452  BP: 137/74 (!) 136/57  Pulse: (!) 51 77  Resp:    Temp: 97.7 F (36.5 C) 97.7 F (36.5 C)  SpO2: 98% 97%   Vitals:   05/22/23 2046 05/23/23 0418 05/23/23 0738 05/23/23 1452  BP: (!) 164/85 138/75 137/74 (!) 136/57  Pulse: 93 68 (!) 51 77  Resp: 18     Temp: 97.7 F (36.5 C) 97.9 F (  36.6 C) 97.7 F (36.5 C) 97.7 F (36.5 C)  TempSrc: Oral Oral Oral Oral  SpO2: 98% 96% 98% 97%  Weight:      Height:       Body mass index is 32.55 kg/m.   General: Alert awake, not in obvious distress, obese HENT: pupils equally reacting to light,  No scleral pallor or icterus noted. Oral mucosa is moist.  Chest:  Clear breath sounds.  No crackles or wheezes.  CVS: S1 &S2 heard. No murmur.  Regular rate and  rhythm. Abdomen: Soft, nontender, nondistended.  Bowel sounds are heard.   Extremities: No cyanosis, clubbing, Left  Lower extremity with short leg splint in place.  Able to wiggle toes with distal capillary refill.  Psych: Alert, awake and oriented, normal mood CNS:  No cranial nerve deficits.  Moves all extremities. Skin: Warm and dry.  No rashes noted.  The results of significant diagnostics from this hospitalization (including imaging, microbiology, ancillary and laboratory) are listed below for reference.     Diagnostic Studies:   DG Ankle Complete Left  Result Date: 05/21/2023 CLINICAL DATA:  Upper reduction internal fixation. EXAM: LEFT ANKLE COMPLETE - 3+ VIEW COMPARISON:  Preoperative imaging. FINDINGS: Four fluoroscopic spot views of the left ankle obtained in the operating room. Lateral plate and screw fixation of distal fibular fracture with syndesmotic trapeze. Fluoroscopy time 1 minute. Fluoroscopy dose 1.79 mGy. IMPRESSION: Intraoperative fluoroscopy during ORIF of distal fibular fracture. Electronically Signed   By: Narda Rutherford M.D.   On: 05/21/2023 20:27   DG C-Arm 1-60 Min-No Report  Result Date: 05/21/2023 Fluoroscopy was utilized by the requesting physician.  No radiographic interpretation.   CT ANKLE LEFT WO CONTRAST  Result Date: 05/20/2023 CLINICAL DATA:  Ankle trauma, fracture, xray done (Age >= 5y) EXAM: CT OF THE LEFT ANKLE WITHOUT CONTRAST TECHNIQUE: Multidetector CT imaging of the left ankle was performed according to the standard protocol. Multiplanar CT image reconstructions were also generated. RADIATION DOSE REDUCTION: This exam was performed according to the departmental dose-optimization program which includes automated exposure control, adjustment of the mA and/or kV according to patient size and/or use of iterative reconstruction technique. COMPARISON:  Radiographs 05/17/2023 FINDINGS: Bones/Joint/Cartilage Oblique displaced fibular fracture, fracture  displacement of 6 mm. Fracture is at the level of the ankle mortise. Minimally displaced and comminuted posterior tibial tubercle fracture. There is lateral talar tilt. Widening of the medial clear space with tiny ossific densities adjacent to the talus can be seen in the setting of ligamentous injury. Tiny punctate ossific densities in the joint space anteriorly. Tarsal bones are intact. Os navicular. Ligaments Suboptimally assessed by CT. Muscles and Tendons Thickening of the peroneal tendons at the fracture site. No evidence of tendinous disruption. No intramuscular hematoma. Intact Achilles tendon with small enthesophyte. Soft tissues Generalized soft tissue edema. IMPRESSION: 1. Oblique displaced fibular fracture, fracture displacement of 6 mm. 2. Minimally displaced and comminuted posterior tibial tubercle fracture. 3. Widening of the medial ankle mortise with tiny adjacent ossific densities can be seen in the setting of ligamentous injury/avulsion type fracture. Tiny punctate ossific densities in the joint space anteriorly. 4. Talar tilt with widening of the medial ankle mortise. Electronically Signed   By: Narda Rutherford M.D.   On: 05/20/2023 21:18     Labs:   Basic Metabolic Panel: Recent Labs  Lab 05/21/23 0051 05/22/23 0139  NA 137 136  K 3.9 3.9  CL 100 104  CO2 24 22  GLUCOSE 106* 213*  BUN 18 17  CREATININE 1.03 1.04  CALCIUM 8.9 8.6*  MG  --  1.9   GFR Estimated Creatinine Clearance: 91.4 mL/min (by C-G formula based on SCr of 1.04 mg/dL). Liver Function Tests: No results for input(s): "AST", "ALT", "ALKPHOS", "BILITOT", "PROT", "ALBUMIN" in the last 168 hours. No results for input(s): "LIPASE", "AMYLASE" in the last 168 hours. No results for input(s): "AMMONIA" in the last 168 hours. Coagulation profile No results for input(s): "INR", "PROTIME" in the last 168 hours.  CBC: Recent Labs  Lab 05/21/23 0051 05/22/23 0139  WBC 6.6 5.9  HGB 11.9* 12.1*  HCT 36.4*  35.9*  MCV 88.3 87.3  PLT 142* 125*   Cardiac Enzymes: No results for input(s): "CKTOTAL", "CKMB", "CKMBINDEX", "TROPONINI" in the last 168 hours. BNP: Invalid input(s): "POCBNP" CBG: Recent Labs  Lab 05/20/23 1707 05/20/23 2152 05/21/23 1521 05/21/23 2002  GLUCAP 146* 150* 103* 96   D-Dimer No results for input(s): "DDIMER" in the last 72 hours. Hgb A1c Recent Labs    05/20/23 2257  HGBA1C 8.4*   Lipid Profile No results for input(s): "CHOL", "HDL", "LDLCALC", "TRIG", "CHOLHDL", "LDLDIRECT" in the last 72 hours. Thyroid function studies No results for input(s): "TSH", "T4TOTAL", "T3FREE", "THYROIDAB" in the last 72 hours.  Invalid input(s): "FREET3" Anemia work up No results for input(s): "VITAMINB12", "FOLATE", "FERRITIN", "TIBC", "IRON", "RETICCTPCT" in the last 72 hours. Microbiology Recent Results (from the past 240 hour(s))  Surgical pcr screen     Status: Abnormal   Collection Time: 05/21/23  7:02 AM   Specimen: Nasal Mucosa; Nasal Swab  Result Value Ref Range Status   MRSA, PCR NEGATIVE NEGATIVE Final   Staphylococcus aureus POSITIVE (A) NEGATIVE Final    Comment: (NOTE) The Xpert SA Assay (FDA approved for NASAL specimens in patients 76 years of age and older), is one component of a comprehensive surveillance program. It is not intended to diagnose infection nor to guide or monitor treatment. Performed at [facility], [address], [place], [place] [address]      Discharge Instructions:   Discharge Instructions     Diet - low sodium heart healthy   Complete by: As directed    Discharge instructions   Complete by: As directed    Follow-up with your primary care provider in 1 to 2 weeks..  Check blood at that time.  Follow-up with orthopedics Dr Odis Hollingshead in 1 weeks for surgical checku/exchange of cast.  Seek medical attention for worsening symptoms.  Continue physical therapy at home.  Nonweightbearing on the operative extremity with leg  elevation when lying or sitting down.   Increase activity slowly   Complete by: As directed       Allergies as of 05/23/2023   No Known Allergies      Medication List     STOP taking these medications    diclofenac 75 MG EC tablet Commonly known as: VOLTAREN       TAKE these medications    acetaminophen 500 MG tablet Commonly known as: TYLENOL Take 1,000 mg by mouth daily as needed for moderate pain.   albuterol 108 (90 Base) MCG/ACT inhaler Commonly known as: VENTOLIN HFA Inhale 2 puffs into the lungs every 4 (four) hours as needed for shortness of breath.   amitriptyline 25 MG tablet Commonly known as: ELAVIL Take 150 mg by mouth at bedtime.   amLODipine 5 MG tablet Commonly known as: NORVASC Take 5 mg by mouth daily.   aspirin 81 MG  chewable tablet Chew 1 tablet (81 mg total) by mouth 2 (two) times daily for 21 days.   Jardiance 10 MG Tabs tablet Generic drug: empagliflozin Take 10 mg by mouth daily.   meloxicam 15 MG tablet Commonly known as: MOBIC Take 15 mg by mouth daily.   NexIUM 40 MG capsule Generic drug: esomeprazole Take 1 capsule by mouth daily.   oxyCODONE-acetaminophen 5-325 MG tablet Commonly known as: PERCOCET/ROXICET Take 1 tablet by mouth every 6 (six) hours as needed for severe pain.   traZODone 100 MG tablet Commonly known as: DESYREL Take 75 mg by mouth at bedtime.   zolpidem 10 MG tablet Commonly known as: AMBIEN Take 10 mg by mouth at bedtime as needed for sleep.               Durable Medical Equipment  (From admission, onward)           Start     Ordered   05/23/23 1012  For home use only DME lightweight manual wheelchair with seat cushion  Once       Comments: Patient suffers from close left ankle trimalleolar fracture s/p ORIF  which impairs their ability to perform daily activities like bathing in the home.  A walker will not resolve  issue with performing activities of daily living. A wheelchair will allow  patient to safely perform daily activities. Patient is not able to propel themselves in the home using a standard weight wheelchair due to general weakness. Patient can self propel in the lightweight wheelchair. Length of need 6 months . Accessories: elevating leg rests (ELRs), wheel locks, extensions and anti-tippers.   05/23/23 1013   05/23/23 1011  For home use only DME Walker rolling  Once       Question Answer Comment  Walker: With 5 Inch Wheels   Patient needs a walker to treat with the following condition Fx      05/23/23 1013            Follow-up Information     Fleet Contras, MD Follow up.   Specialty: Internal Medicine Contact information: [address] [place] [address] [phone]         Netta Cedars, MD. Schedule an appointment as soon as possible for a visit in 1 week(s).   Specialty: Orthopedic Surgery Why: cast change, surgical followup Contact information: [address] [place] [address] [phone]                  Time coordinating discharge: 39 minutes  Signed:     [facility] 05/23/2023, 5:16 PM

## 2023-05-23 NOTE — Progress Notes (Signed)
Patient received AVS, instruction given by primary nurse. Patient understood the information given, patient left unit with belongings , accompanied by his friend

## 2023-05-23 NOTE — Progress Notes (Signed)
    Durable Medical Equipment  (From admission, onward)           Start     Ordered   05/23/23 1012  For home use only DME lightweight manual wheelchair with seat cushion  Once       Comments: Patient suffers from close left ankle trimalleolar fracture s/p ORIF  which impairs their ability to perform daily activities like bathing in the home.  A walker will not resolve  issue with performing activities of daily living. A wheelchair will allow patient to safely perform daily activities. Patient is not able to propel themselves in the home using a standard weight wheelchair due to general weakness. Patient can self propel in the lightweight wheelchair. Length of need 6 months . Accessories: elevating leg rests (ELRs), wheel locks, extensions and anti-tippers.   05/23/23 1013   05/23/23 1011  For home use only DME Walker rolling  Once       Question Answer Comment  Walker: With 5 Inch Wheels   Patient needs a walker to treat with the following condition Fx      05/23/23 1013

## 2023-05-23 NOTE — Care Management Important Message (Signed)
Important Message  Patient Details  Name: Duane Hancock MRN: [id number] Date of Birth: 1956/09/04   Medicare Important Message Given:  Yes     Sherilyn Banker 05/23/2023, 2:46 PM

## 2023-05-23 NOTE — Progress Notes (Signed)
Pt's valuables enveloped returned and opened in front of pt  and checked all items , Pt has no complaints, signed  and  witnessed by this Clinical research associate.

## 2023-05-23 NOTE — TOC Transition Note (Signed)
Transition of Care [facility]) - CM/SW Discharge Note   Patient Details  Name: Duane Hancock MRN: [id number] Date of Birth: 1956/05/27  [facility]) CM/SW Contact:  Epifanio Lesches, RN Phone Number: 05/23/2023, 10:21 AM   Clinical Narrative:    Patient will DC to: home Anticipated DC date: 05/23/2023 Family notified: yes Transport by: car    - s/p close left ankle trimalleolar fracture status post ORIF 05/21/2023  Per MD patient ready for DC today. RN, and patient notified of DC. Pt states friends will assist with care once home. Order noted for home health and DME needs. Pt agreeable to home health services. Pt without provider preference. Referral made with [facility] and accepted. Referral made with [facility]  for DME: RW, W/C. Equipment will be delivered to bedside prior to d/c. Pt without RX med concerns.  Post hospital f/u noted on AVS.  Friend to provide transportation ton home.  RNCM will sign off for now as intervention is no longer needed. Please consult [place] again if new needs arise.    Final next level of care: Home w Home Health Services Barriers to Discharge: No Barriers Identified   Patient Goals and CMS Choice   Choice offered to / list presented to : Patient  Discharge Placement                         Discharge Plan and Services Additional resources added to the After Visit Summary for     Discharge Planning Services: CM Consult            DME Arranged: Dan Humphreys rolling, Community education officer wheelchair with seat cushion DME Agency: [facility] Date DME Agency Contacted: 05/23/23 Time DME Agency Contacted: 1021 Representative spoke with at DME Agency: Ian Malkin HH Arranged: PT HH Agency: [facility] Date [facility] Agency Contacted: 05/23/23 Time HH Agency Contacted: 1021 Representative spoke with at [facility] Agency: Tresa Endo  Social Determinants of Health (SDOH) Interventions SDOH Screenings   Food Insecurity: No Food Insecurity  (05/20/2023)  Housing: Low Risk  (05/20/2023)  Transportation Needs: No Transportation Needs (05/20/2023)  Utilities: Not At Risk (05/20/2023)  Tobacco Use: Medium Risk (05/21/2023)     Readmission Risk Interventions     No data to display

## 2023-05-26 ENCOUNTER — Encounter (HOSPITAL_BASED_OUTPATIENT_CLINIC_OR_DEPARTMENT_OTHER): Payer: Self-pay

## 2023-05-26 ENCOUNTER — Ambulatory Visit (HOSPITAL_BASED_OUTPATIENT_CLINIC_OR_DEPARTMENT_OTHER): Admit: 2023-05-26 | Payer: 59 | Admitting: Orthopaedic Surgery

## 2023-05-26 SURGERY — OPEN REDUCTION INTERNAL FIXATION (ORIF) ANKLE FRACTURE
Anesthesia: Choice | Site: Ankle | Laterality: Left

## 2023-06-02 ENCOUNTER — Other Ambulatory Visit: Payer: Self-pay | Admitting: Internal Medicine

## 2023-06-03 LAB — CBC
HCT: 43.7 % (ref 38.5–50.0)
Hemoglobin: 14.6 g/dL (ref 13.2–17.1)
MCH: 29.5 pg (ref 27.0–33.0)
MCHC: 33.4 g/dL (ref 32.0–36.0)
MCV: 88.3 fL (ref 80.0–100.0)
MPV: 11.6 fL (ref 7.5–12.5)
Platelets: 183 10*3/uL (ref 140–400)
RBC: 4.95 10*6/uL (ref 4.20–5.80)
RDW: 13.1 % (ref 11.0–15.0)
WBC: 6.8 10*3/uL (ref 3.8–10.8)

## 2023-06-03 LAB — BASIC METABOLIC PANEL WITH GFR
BUN: 12 mg/dL (ref 7–25)
CO2: 22 mmol/L (ref 20–32)
Calcium: 9.3 mg/dL (ref 8.6–10.3)
Chloride: 106 mmol/L (ref 98–110)
Creat: 1.04 mg/dL (ref 0.70–1.35)
Glucose, Bld: 121 mg/dL — ABNORMAL HIGH (ref 65–99)
Potassium: 4.1 mmol/L (ref 3.5–5.3)
Sodium: 138 mmol/L (ref 135–146)
eGFR: 80 mL/min/{1.73_m2} (ref >=60)

## 2023-06-03 LAB — MAGNESIUM: Magnesium: 2 mg/dL (ref 1.5–2.5)
